# Patient Record
Sex: Male | Born: 1985 | Race: Black or African American | Hispanic: No | Marital: Single | Smoking: Current every day smoker
Health system: Southern US, Community
[De-identification: ages and names within clinical notes are randomized; demographics above are authoritative.]

---
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
## 2003-09-18 ENCOUNTER — Emergency Department (HOSPITAL_COMMUNITY): Admission: EM | Admit: 2003-09-18 | Discharge: 2003-09-18 | Payer: Self-pay | Admitting: Emergency Medicine

## 2011-01-11 ENCOUNTER — Emergency Department (HOSPITAL_COMMUNITY): Payer: Self-pay

## 2011-01-11 ENCOUNTER — Emergency Department (HOSPITAL_COMMUNITY)
Admission: EM | Admit: 2011-01-11 | Discharge: 2011-01-11 | Disposition: A | Discharge status: Home / Self Care | Payer: Self-pay | Attending: Emergency Medicine | Admitting: Emergency Medicine

## 2011-01-11 DIAGNOSIS — S5000XA Contusion of unspecified elbow, initial encounter: Secondary | ICD-10-CM | POA: Insufficient documentation

## 2011-01-11 DIAGNOSIS — M25529 Pain in unspecified elbow: Secondary | ICD-10-CM | POA: Insufficient documentation

## 2011-01-11 DIAGNOSIS — W1809XA Striking against other object with subsequent fall, initial encounter: Secondary | ICD-10-CM | POA: Insufficient documentation

## 2011-01-11 DIAGNOSIS — IMO0002 Reserved for concepts with insufficient information to code with codable children: Secondary | ICD-10-CM | POA: Insufficient documentation

## 2013-12-13 ENCOUNTER — Encounter (HOSPITAL_COMMUNITY): Payer: Self-pay | Admitting: Emergency Medicine

## 2013-12-13 ENCOUNTER — Emergency Department (HOSPITAL_COMMUNITY)
Admission: EM | Admit: 2013-12-13 | Discharge: 2013-12-13 | Disposition: A | Discharge status: Home / Self Care | Payer: Self-pay | Attending: Emergency Medicine | Admitting: Emergency Medicine

## 2013-12-13 ENCOUNTER — Emergency Department (HOSPITAL_COMMUNITY): Payer: Self-pay

## 2013-12-13 DIAGNOSIS — Z720 Tobacco use: Secondary | ICD-10-CM | POA: Insufficient documentation

## 2013-12-13 DIAGNOSIS — Y9289 Other specified places as the place of occurrence of the external cause: Secondary | ICD-10-CM | POA: Insufficient documentation

## 2013-12-13 DIAGNOSIS — S99921A Unspecified injury of right foot, initial encounter: Secondary | ICD-10-CM

## 2013-12-13 DIAGNOSIS — W101XXA Fall (on)(from) sidewalk curb, initial encounter: Secondary | ICD-10-CM | POA: Insufficient documentation

## 2013-12-13 DIAGNOSIS — Y9389 Activity, other specified: Secondary | ICD-10-CM | POA: Insufficient documentation

## 2013-12-13 MED ORDER — TRAMADOL-ACETAMINOPHEN 37.5-325 MG PO TABS: 1.0000 | ORAL_TABLET | Freq: Four times a day (QID) | ORAL | Status: DC | PRN

## 2013-12-13 MED ORDER — IBUPROFEN 200 MG PO TABS
400.0000 mg | ORAL_TABLET | Freq: Once | ORAL | Status: AC
  Administered 2013-12-13: 400 mg via ORAL
  Filled 2013-12-13: qty 2

## 2013-12-13 MED ORDER — OXYCODONE-ACETAMINOPHEN 5-325 MG PO TABS
1.0000 | ORAL_TABLET | Freq: Once | ORAL | Status: AC
  Administered 2013-12-13: 1 via ORAL
  Filled 2013-12-13: qty 1
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
## 2013-12-13 NOTE — Discharge Instructions (Signed)
Take the prescribed medication as directed.  Recommend icing and elevating foot at home to help with pain and swelling. Follow-up with orthopedics if no improvement in the next week or if symptoms worsen. Return to the ED for new or worsening symptoms.
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
## 2013-12-14 NOTE — ED Provider Notes (Signed)
Medical screening examination/treatment/procedure(s) were performed by non-physician practitioner and as supervising physician I was immediately available for consultation/collaboration.   EKG Interpretation None        Samuel JesterKathleen Shaunta Oncale, DO 12/14/13 1606

## 2016-05-09 ENCOUNTER — Encounter (HOSPITAL_COMMUNITY): Payer: Self-pay | Admitting: Emergency Medicine

## 2016-05-09 ENCOUNTER — Emergency Department (HOSPITAL_COMMUNITY)
Admission: EM | Admit: 2016-05-09 | Discharge: 2016-05-09 | Disposition: A | Discharge status: Home / Self Care | Payer: Self-pay | Attending: Emergency Medicine | Admitting: Emergency Medicine

## 2016-05-09 DIAGNOSIS — B9689 Other specified bacterial agents as the cause of diseases classified elsewhere: Secondary | ICD-10-CM | POA: Insufficient documentation

## 2016-05-09 DIAGNOSIS — J028 Acute pharyngitis due to other specified organisms: Secondary | ICD-10-CM | POA: Insufficient documentation

## 2016-05-09 DIAGNOSIS — F17200 Nicotine dependence, unspecified, uncomplicated: Secondary | ICD-10-CM | POA: Insufficient documentation

## 2016-05-09 LAB — RAPID STREP SCREEN (MED CTR MEBANE ONLY): Streptococcus, Group A Screen (Direct): NEGATIVE

## 2016-05-09 MED ORDER — PENICILLIN V POTASSIUM 500 MG PO TABS: 500.0000 mg | ORAL_TABLET | Freq: Three times a day (TID) | ORAL | 0 refills | Status: DC

## 2016-05-09 MED ORDER — HYDROCODONE-ACETAMINOPHEN 5-325 MG PO TABS
1.0000 | ORAL_TABLET | Freq: Once | ORAL | Status: AC
  Administered 2016-05-09: 1 via ORAL
  Filled 2016-05-09: qty 1

## 2016-05-09 MED ORDER — IBUPROFEN 800 MG PO TABS: 800.0000 mg | ORAL_TABLET | Freq: Three times a day (TID) | ORAL | 0 refills | Status: DC

## 2016-05-09 MED ORDER — ACETAMINOPHEN 325 MG PO TABS
650.0000 mg | ORAL_TABLET | Freq: Once | ORAL | Status: AC | PRN
  Administered 2016-05-09: 650 mg via ORAL

## 2016-05-09 MED ORDER — ACETAMINOPHEN 325 MG PO TABS
ORAL_TABLET | ORAL | Status: AC
  Filled 2016-05-09: qty 2

## 2016-05-09 NOTE — ED Triage Notes (Signed)
Pt presents to ED for assessment of sore throat, generalized body aches and fevers at home x 3 days.  Fever in triage.

## 2016-05-09 NOTE — Discharge Instructions (Signed)
You may take tylenol in addition to the medications we give you. Return as needed for worsening symptoms.

## 2016-05-09 NOTE — ED Provider Notes (Signed)
[facility] Provider Note    By signing my name below, I, Jason Flynn, attest that this documentation has been prepared under the direction and in the presence of [facility], [place]. Electronically Signed: Earmon Flynn, ED Scribe. 05/09/16. 9:24 PM.    History   Chief Complaint Chief Complaint  Patient presents with  . Sore Throat     The history is provided by the patient and medical records. No language interpreter was used.    Jason Flynn is a 31 y.o. male who presents to the Emergency Department complaining of sore throat that began three days ago. He reports associated fever, chills, HA and generalized body aches. He has not taken anything for pain relief. Swallowing intensifies his pain. There are no alleviating factors noted. He denies difficulty swallowing or breathing, drooling, dental issues, otalgia, visual changes, neck pain, abdominal pain, nausea, vomiting. He denies allergies to anything.   History reviewed. No pertinent past medical history.  There are no active problems to display for this patient.   History reviewed. No pertinent surgical history.     Home Medications    Prior to Admission medications   Medication Sig Start Date End Date Taking? Authorizing Provider  ibuprofen (ADVIL,MOTRIN) 800 MG tablet Take 1 tablet (800 mg total) by mouth 3 (three) times daily. 05/09/16   Bilbo Carcamo Orlene Och, NP  penicillin v potassium (VEETID) 500 MG tablet Take 1 tablet (500 mg total) by mouth 3 (three) times daily. 05/09/16   Becket Wecker Orlene Och, NP    Family History History reviewed. No pertinent family history.  Social History Social History  Substance Use Topics  . Smoking status: Current Every Day Smoker    Packs/day: 0.50  . Smokeless tobacco: Never Used  . Alcohol use No     Allergies   Patient has no known allergies.   Review of Systems Review of Systems  Constitutional: Positive for chills and fever.  HENT: Positive for ear pain (left)  and sore throat. Negative for congestion, dental problem, drooling, facial swelling and sinus pressure.   Eyes: Negative for redness and itching.  Respiratory: Negative for cough and shortness of breath.   Cardiovascular: Negative for chest pain.  Gastrointestinal: Negative for abdominal pain, nausea and vomiting.  Musculoskeletal: Positive for myalgias. Negative for back pain.  Skin: Negative for color change and rash.  Neurological: Positive for headaches. Negative for speech difficulty.  Hematological: Positive for adenopathy.  Psychiatric/Behavioral: Negative for confusion.     Physical Exam Updated Vital Signs BP 126/74 (BP Location: Left Arm)   Pulse 107   Temp 102.2 F (39 C) (Oral)   Resp 18   Ht 5\' 6"  (1.676 m)   Wt 165 lb (74.8 kg)   SpO2 99%   BMI 26.63 kg/m   Physical Exam  Constitutional: He appears well-developed and well-nourished. No distress.  HENT:  Head: Normocephalic.  Right Ear: Tympanic membrane normal.  Left Ear: Tympanic membrane normal.  Mouth/Throat: Uvula is midline and mucous membranes are normal. No trismus in the jaw. Posterior oropharyngeal erythema present. No tonsillar abscesses. No tonsillar exudate.  Bilateral tonsillar enlargement. Kissing tonsils noted.  Eyes: EOM are normal.  Neck: Normal range of motion. Neck supple.  Cardiovascular: Normal rate and regular rhythm.   Pulmonary/Chest: Effort normal and breath sounds normal.  Abdominal: Soft. Bowel sounds are normal. There is no tenderness.  Musculoskeletal: Normal range of motion.  Lymphadenopathy:    He has cervical adenopathy.  Neurological: He is alert.  Skin:  Skin is warm and dry.  Psychiatric: He has a normal mood and affect. His behavior is normal.  Nursing note and vitals reviewed.    ED Treatments / Results   DIAGNOSTIC STUDIES: Oxygen Saturation is 99% on RA, normal by my interpretation.   COORDINATION OF CARE: 9:20 PM- based on clinical findings, will treat for  strep throat although rapid strep was negative. Culture pending. Pt verbalizes understanding and agrees to plan.  Medications  acetaminophen (TYLENOL) tablet 650 mg (650 mg Oral Given 05/09/16 1636)  HYDROcodone-acetaminophen (NORCO/VICODIN) 5-325 MG per tablet 1 tablet (1 tablet Oral Given 05/09/16 2128)     Labs (all labs ordered are listed, but only abnormal results are displayed) Labs Reviewed  RAPID STREP SCREEN (NOT AT [facility])  CULTURE, GROUP A STREP [facility])   Radiology No results found.  Procedures Procedures (including critical care time)  Medications Ordered in ED Medications  acetaminophen (TYLENOL) tablet 650 mg (650 mg Oral Given 05/09/16 1636)  HYDROcodone-acetaminophen (NORCO/VICODIN) 5-325 MG per tablet 1 tablet (1 tablet Oral Given 05/09/16 2128)     Initial Impression / Assessment and Plan / ED Course  I have reviewed the triage vital signs and the nursing notes.  Pt presents for a sore throat, fever, chills and generalized body aches for the past three days. Pt rapid strep test negative. Clinically pt presents as strep throat so will treat accordingly. Pt is tolerating secretions. Presentation not concerning for peritonsillar abscess or spread of infection to deep spaces of the throat; patent airway. Pt will be discharged with prescription for PCN. Specific return precautions discussed. Recommended PCP follow up. Pt appears safe for discharge.  I personally performed the services described in this documentation, which was scribed in my presence. The recorded information has been reviewed and is accurate.   Final Clinical Impressions(s) / ED Diagnoses   Final diagnoses:  Acute pharyngitis due to other specified organisms    New Prescriptions Discharge Medication List as of 05/09/2016  9:28 PM    START taking these medications   Details  ibuprofen (ADVIL,MOTRIN) 800 MG tablet Take 1 tablet (800 mg total) by mouth 3 (three) times daily., Starting Fri 05/09/2016, Print      penicillin v potassium (VEETID) 500 MG tablet Take 1 tablet (500 mg total) by mouth 3 (three) times daily., Starting Fri 05/09/2016, Print         OrlandHope M Hila Bolding, NP 05/10/16 0210    Mancel BaleElliott Wentz, MD 05/11/16 [phone]

## 2016-05-12 LAB — CULTURE, GROUP A STREP (THRC)

## 2016-06-03 ENCOUNTER — Encounter (HOSPITAL_COMMUNITY): Payer: Self-pay

## 2016-06-03 ENCOUNTER — Emergency Department (HOSPITAL_COMMUNITY)
Admission: EM | Admit: 2016-06-03 | Discharge: 2016-06-03 | Disposition: A | Discharge status: Home / Self Care | Payer: Self-pay | Attending: Emergency Medicine | Admitting: Emergency Medicine

## 2016-06-03 DIAGNOSIS — K029 Dental caries, unspecified: Secondary | ICD-10-CM | POA: Insufficient documentation

## 2016-06-03 DIAGNOSIS — K0889 Other specified disorders of teeth and supporting structures: Secondary | ICD-10-CM | POA: Insufficient documentation

## 2016-06-03 DIAGNOSIS — F17200 Nicotine dependence, unspecified, uncomplicated: Secondary | ICD-10-CM | POA: Insufficient documentation

## 2016-06-03 MED ORDER — PENICILLIN V POTASSIUM 500 MG PO TABS: 500.0000 mg | ORAL_TABLET | Freq: Three times a day (TID) | ORAL | 0 refills | Status: DC

## 2016-06-03 MED ORDER — IBUPROFEN 800 MG PO TABS
800.0000 mg | ORAL_TABLET | Freq: Once | ORAL | Status: AC
  Administered 2016-06-03: 800 mg via ORAL
  Filled 2016-06-03: qty 1

## 2016-06-03 MED ORDER — IBUPROFEN 800 MG PO TABS: 800.0000 mg | ORAL_TABLET | Freq: Three times a day (TID) | ORAL | 0 refills | Status: DC

## 2016-06-03 NOTE — Discharge Instructions (Signed)
Please read and follow all provided instructions.  Your diagnoses today include:  1. Toothache     The exam and treatment you received today has been provided on an emergency basis only. This is not a substitute for complete medical or dental care.  Tests performed today include:  Vital signs. See below for your results today.   Medications prescribed:   Penicillin - antibiotic  You have been prescribed an antibiotic medicine: take the entire course of medicine even if you are feeling better. Stopping early can cause the antibiotic not to work.   Ibuprofen (Motrin, Advil) - anti-inflammatory pain medication  Do not exceed 600mg  ibuprofen every 6 hours, take with food  You have been prescribed an anti-inflammatory medication or NSAID. Take with food. Take smallest effective dose for the shortest duration needed for your pain. Stop taking if you experience stomach pain or vomiting.   Take any prescribed medications only as directed.  Home care instructions:  Follow any educational materials contained in this packet.  Follow-up instructions: Please follow-up with your dentist for further evaluation of your symptoms.   Dental Assistance: See dental referrals  Return instructions:   Please return to the Emergency Department if you experience worsening symptoms.  Please return if you develop a fever, you develop more swelling in your face or neck, you have trouble breathing or swallowing food.  Please return if you have any other emergent concerns.  Additional Information:  Your vital signs today were: BP 138/84 (BP Location: Left Arm)    Pulse (!) 54    Temp 99.1 F (37.3 C) (Oral)    Resp 16    SpO2 98%  If your blood pressure (BP) was elevated above 135/85 this visit, please have this repeated by your doctor within one month. --------------

## 2016-06-03 NOTE — ED Triage Notes (Signed)
Pt here for dental pain. Decay noted to right upper tooth. Pt states it has been bothering him for months but he has been unable to get the tooth pulled. Pt states dental pain is causing headache.

## 2016-06-03 NOTE — ED Provider Notes (Signed)
[facility] Provider Note   CSN: [id number] Arrival date & time: 06/03/16  [id number]     History   Chief Complaint Chief Complaint  Patient presents with  . Dental Pain    HPI Jason Flynn is a 31 y.o. male.  Patient presents with complaint of right-sided lower dental pain for 2 days. Pain radiates to the right face. No associated facial swelling, fevers, vomiting. No difficulty swallowing or breathing. Onset of symptoms acute. Course is constant. Nothing makes symptoms better or worse.      History reviewed. No pertinent past medical history.  There are no active problems to display for this patient.   History reviewed. No pertinent surgical history.     Home Medications    Prior to Admission medications   Medication Sig Start Date End Date Taking? Authorizing Provider  ibuprofen (ADVIL,MOTRIN) 800 MG tablet Take 1 tablet (800 mg total) by mouth 3 (three) times daily. 06/03/16   Renne CriglerJoshua Nanami Whitelaw, PA-C  penicillin v potassium (VEETID) 500 MG tablet Take 1 tablet (500 mg total) by mouth 3 (three) times daily. 06/03/16   Renne CriglerJoshua Mattia Liford, PA-C    Family History History reviewed. No pertinent family history.  Social History Social History  Substance Use Topics  . Smoking status: Current Every Day Smoker    Packs/day: 0.50  . Smokeless tobacco: Never Used  . Alcohol use No     Allergies   Patient has no known allergies.   Review of Systems Review of Systems  Constitutional: Negative for fever.  HENT: Positive for dental problem. Negative for ear pain, facial swelling, sore throat and trouble swallowing.   Respiratory: Negative for shortness of breath and stridor.   Musculoskeletal: Negative for neck pain.  Skin: Negative for color change.  Neurological: Negative for headaches.     Physical Exam Updated Vital Signs BP 138/84 (BP Location: Left Arm)   Pulse (!) 54   Temp 99.1 F (37.3 C) (Oral)   Resp 16   SpO2 98%   Physical Exam    Constitutional: He appears well-developed and well-nourished.  HENT:  Head: Normocephalic and atraumatic.  Right Ear: Tympanic membrane, external ear and ear canal normal.  Left Ear: Tympanic membrane, external ear and ear canal normal.  Nose: Nose normal.  Mouth/Throat: Uvula is midline, oropharynx is clear and moist and mucous membranes are normal. No trismus in the jaw. Abnormal dentition. Dental caries present. No dental abscesses or uvula swelling. No tonsillar abscesses.  Patient with advanced periodontal disease. There is a crack noted in tooth #29 which is where patient is hurting. Generalized edema of the gums without gross or palpable abscess.  Eyes: Pupils are equal, round, and reactive to light.  Neck: Normal range of motion. Neck supple.  No neck swelling or Lugwig's angina  Neurological: He is alert.  Skin: Skin is warm and dry.  Psychiatric: He has a normal mood and affect.  Nursing note and vitals reviewed.    ED Treatments / Results   Procedures Procedures (including critical care time)  Medications Ordered in ED Medications  ibuprofen (ADVIL,MOTRIN) tablet 800 mg (not administered)     Initial Impression / Assessment and Plan / ED Course  I have reviewed the triage vital signs and the nursing notes.  Pertinent labs & imaging results that were available during my care of the patient were reviewed by me and considered in my medical decision making (see chart for details).     10:28 AM Patient seen and examined.  Medications ordered.   Vital signs reviewed and are as follows: BP 138/84 (BP Location: Left Arm)   Pulse (!) 54   Temp 99.1 F (37.3 C) (Oral)   Resp 16   SpO2 98%   Patient counseled to take prescribed medications as directed, return with worsening facial or neck swelling, and to follow-up with their dentist as soon as possible.    Final Clinical Impressions(s) / ED Diagnoses   Final diagnoses:  Toothache   Patient with toothache. No  fever. Exam unconcerning for Ludwig's angina or other deep tissue infection in neck.   As there is gum swelling, erythema, will treat with antibiotic and pain medicine. Urged patient to follow-up with dentist.     New Prescriptions New Prescriptions   IBUPROFEN (ADVIL,MOTRIN) 800 MG TABLET    Take 1 tablet (800 mg total) by mouth 3 (three) times daily.   PENICILLIN V POTASSIUM (VEETID) 500 MG TABLET    Take 1 tablet (500 mg total) by mouth 3 (three) times daily.     Renne Crigler, PA-C 06/03/16 1029    Alvira Monday, MD 06/07/16 1819

## 2016-10-14 ENCOUNTER — Emergency Department (HOSPITAL_COMMUNITY)
Admission: EM | Admit: 2016-10-14 | Discharge: 2016-10-14 | Disposition: A | Discharge status: Home / Self Care | Payer: Self-pay | Attending: Physician Assistant | Admitting: Physician Assistant

## 2016-10-14 ENCOUNTER — Emergency Department (HOSPITAL_COMMUNITY): Payer: Self-pay

## 2016-10-14 ENCOUNTER — Encounter (HOSPITAL_COMMUNITY): Payer: Self-pay | Admitting: *Deleted

## 2016-10-14 ENCOUNTER — Encounter (HOSPITAL_COMMUNITY): Payer: Self-pay | Admitting: Emergency Medicine

## 2016-10-14 ENCOUNTER — Emergency Department (HOSPITAL_COMMUNITY)
Admission: EM | Admit: 2016-10-14 | Discharge: 2016-10-14 | Disposition: A | Discharge status: Home / Self Care | Payer: Self-pay | Attending: Emergency Medicine | Admitting: Emergency Medicine

## 2016-10-14 DIAGNOSIS — Y9289 Other specified places as the place of occurrence of the external cause: Secondary | ICD-10-CM | POA: Insufficient documentation

## 2016-10-14 DIAGNOSIS — Y999 Unspecified external cause status: Secondary | ICD-10-CM | POA: Insufficient documentation

## 2016-10-14 DIAGNOSIS — S80212A Abrasion, left knee, initial encounter: Secondary | ICD-10-CM | POA: Insufficient documentation

## 2016-10-14 DIAGNOSIS — X58XXXA Exposure to other specified factors, initial encounter: Secondary | ICD-10-CM | POA: Insufficient documentation

## 2016-10-14 DIAGNOSIS — S60512A Abrasion of left hand, initial encounter: Secondary | ICD-10-CM | POA: Insufficient documentation

## 2016-10-14 DIAGNOSIS — W3400XA Accidental discharge from unspecified firearms or gun, initial encounter: Secondary | ICD-10-CM

## 2016-10-14 DIAGNOSIS — M25562 Pain in left knee: Secondary | ICD-10-CM | POA: Insufficient documentation

## 2016-10-14 DIAGNOSIS — F17200 Nicotine dependence, unspecified, uncomplicated: Secondary | ICD-10-CM | POA: Insufficient documentation

## 2016-10-14 DIAGNOSIS — Y9302 Activity, running: Secondary | ICD-10-CM | POA: Insufficient documentation

## 2016-10-14 MED ORDER — IBUPROFEN 200 MG PO TABS: 600.0000 mg | ORAL_TABLET | Freq: Once | ORAL | Status: DC

## 2016-10-14 MED ORDER — IBUPROFEN 400 MG PO TABS: 400.0000 mg | ORAL_TABLET | Freq: Once | ORAL | Status: DC | PRN

## 2016-10-14 MED ORDER — IBUPROFEN 800 MG PO TABS: 800.0000 mg | ORAL_TABLET | Freq: Three times a day (TID) | ORAL | 0 refills | Status: DC | PRN
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
## 2016-10-14 NOTE — ED Notes (Signed)
[facility]; informed pt of delay

## 2016-10-14 NOTE — Progress Notes (Signed)
Orthopedic Tech Progress Note Patient Details:  Jason MouseDominique L Flynn [birth year] [id number]  Ortho Devices Type of Ortho Device: Crutches, Knee Sleeve Ortho Device/Splint Location: lle Ortho Device/Splint Interventions: Ordered, Application, Adjustment   Trinna PostMartinez, Ashna Dorough J 10/14/2016, 11:25 PM
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
## 2016-10-14 NOTE — Discharge Instructions (Signed)
Rest - avoid putting weight on your left leg for a couple days Ice - ice for 20 minutes at a time, several times a day Compression - wear brace to provide support Elevate - elevate ankle above level of heart Ibuprofen - take with food. Take up to 3-4 times daily
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
## 2017-11-27 ENCOUNTER — Encounter (HOSPITAL_COMMUNITY): Payer: Self-pay

## 2017-11-27 ENCOUNTER — Other Ambulatory Visit: Payer: Self-pay

## 2017-11-27 ENCOUNTER — Emergency Department (HOSPITAL_COMMUNITY)
Admission: EM | Admit: 2017-11-27 | Discharge: 2017-11-27 | Disposition: A | Discharge status: Home / Self Care | Payer: Self-pay | Attending: Emergency Medicine | Admitting: Emergency Medicine

## 2017-11-27 DIAGNOSIS — R1032 Left lower quadrant pain: Secondary | ICD-10-CM | POA: Insufficient documentation

## 2017-11-27 DIAGNOSIS — F17200 Nicotine dependence, unspecified, uncomplicated: Secondary | ICD-10-CM | POA: Insufficient documentation

## 2017-11-27 DIAGNOSIS — R1012 Left upper quadrant pain: Secondary | ICD-10-CM | POA: Insufficient documentation

## 2017-11-27 DIAGNOSIS — R112 Nausea with vomiting, unspecified: Secondary | ICD-10-CM | POA: Insufficient documentation

## 2017-11-27 LAB — CBC WITH DIFFERENTIAL/PLATELET
Basophils Absolute: 0 10*3/uL (ref 0.0–0.1)
Basophils Relative: 0 %
EOS PCT: 0 %
Eosinophils Absolute: 0 10*3/uL (ref 0.0–0.7)
HCT: 37.2 % — ABNORMAL LOW (ref 39.0–52.0)
Hemoglobin: 13 g/dL (ref 13.0–17.0)
Lymphocytes Relative: 8 %
Lymphs Abs: 0.9 10*3/uL (ref 0.7–4.0)
MCH: 25.9 pg — AB (ref 26.0–34.0)
MCHC: 34.9 g/dL (ref 30.0–36.0)
MCV: 74.3 fL — AB (ref 78.0–100.0)
Monocytes Absolute: 0.6 10*3/uL (ref 0.1–1.0)
Monocytes Relative: 6 %
NEUTROS ABS: 9.2 10*3/uL — AB (ref 1.7–7.7)
NEUTROS PCT: 86 %
Platelets: 249 10*3/uL (ref 150–400)
RBC: 5.01 MIL/uL (ref 4.22–5.81)
RDW: 14.5 % (ref 11.5–15.5)
WBC: 10.7 10*3/uL — ABNORMAL HIGH (ref 4.0–10.5)

## 2017-11-27 LAB — COMPREHENSIVE METABOLIC PANEL
ALT: 11 U/L (ref 0–44)
AST: 19 U/L (ref 15–41)
Albumin: 4.1 g/dL (ref 3.5–5.0)
Alkaline Phosphatase: 48 U/L (ref 38–126)
Anion gap: 9 (ref 5–15)
BILIRUBIN TOTAL: 0.5 mg/dL (ref 0.3–1.2)
BUN: 14 mg/dL (ref 6–20)
CALCIUM: 9.4 mg/dL (ref 8.9–10.3)
CO2: 25 mmol/L (ref 22–32)
CREATININE: 1.28 mg/dL — AB (ref 0.61–1.24)
Chloride: 109 mmol/L (ref 98–111)
GFR calc Af Amer: >60 mL/min (ref >60)
GFR calc non Af Amer: >60 mL/min (ref >60)
Glucose, Bld: 130 mg/dL — ABNORMAL HIGH (ref 70–99)
Potassium: 3.4 mmol/L — ABNORMAL LOW (ref 3.5–5.1)
Sodium: 143 mmol/L (ref 135–145)
TOTAL PROTEIN: 7.3 g/dL (ref 6.5–8.1)

## 2017-11-27 LAB — LIPASE, BLOOD: Lipase: 64 U/L — ABNORMAL HIGH (ref 11–51)

## 2017-11-27 MED ORDER — ACETAMINOPHEN 325 MG PO TABS
650.0000 mg | ORAL_TABLET | Freq: Once | ORAL | Status: AC
  Administered 2017-11-27: 650 mg via ORAL
  Filled 2017-11-27: qty 2

## 2017-11-27 MED ORDER — ONDANSETRON 4 MG PO TBDP
4.0000 mg | ORAL_TABLET | Freq: Once | ORAL | Status: AC
  Administered 2017-11-27: 4 mg via ORAL
  Filled 2017-11-27: qty 1

## 2017-11-27 MED ORDER — PROMETHAZINE HCL 25 MG RE SUPP
25.0000 mg | Freq: Once | RECTAL | Status: DC
  Filled 2017-11-27: qty 1

## 2017-11-27 MED ORDER — ONDANSETRON 4 MG PO TBDP: 4.0000 mg | ORAL_TABLET | Freq: Three times a day (TID) | ORAL | 0 refills | Status: DC | PRN

## 2017-11-27 MED ORDER — SODIUM CHLORIDE 0.9 % IV BOLUS: 1000.0000 mL | Freq: Once | INTRAVENOUS | Status: DC

## 2017-11-27 NOTE — ED Provider Notes (Signed)
[facility]-EMERGENCY DEPT Provider Note   CSN: [id number] Arrival date & time: 11/27/17  1152     History   Chief Complaint No chief complaint on file.   HPI Jason Flynn is a 32 y.o. male without significant past medical history, presenting to the ED with complaint of left lower abdominal pain with associated nausea and vomiting.  He states symptoms began this morning gradually worsened.  No medications tried prior to arrival.  He denies constipation or diarrhea, fevers, urinary symptoms.  No history of abdominal surgery.  Does endorse marijuana use last night.  Denies history of diverticulitis.  The history is provided by the patient.    History reviewed. No pertinent past medical history.  There are no active problems to display for this patient.    No past surgical history on file.      Home Medications    Prior to Admission medications   Medication Sig Start Date End Date Taking? Authorizing Provider  ibuprofen (ADVIL,MOTRIN) 800 MG tablet Take 1 tablet (800 mg total) by mouth every 8 (eight) hours as needed. Patient not taking: Reported on 11/27/2017 10/14/16   Long, Arlyss Repress, MD  ondansetron (ZOFRAN ODT) 4 MG disintegrating tablet Take 1 tablet (4 mg total) by mouth every 8 (eight) hours as needed for nausea or vomiting. 11/27/17   Robby Pirani, [place] N, PA-C  penicillin v potassium (VEETID) 500 MG tablet Take 1 tablet (500 mg total) by mouth 3 (three) times daily. Patient not taking: Reported on 11/27/2017 06/03/16   Renne Crigler, PA-C    Family History History reviewed. No pertinent family history.  Social History Social History   Tobacco Use  . Smoking status: Current Every Day Smoker    Packs/day: 0.50  . Smokeless tobacco: Never Used  Substance Use Topics  . Alcohol use: No  . Drug use: Yes    Types: Marijuana    Comment: everyday     Allergies   Patient has no known allergies.   Review of Systems Review of Systems    Constitutional: Negative for fever.  Gastrointestinal: Positive for abdominal pain, nausea and vomiting. Negative for constipation and diarrhea.  Genitourinary: Negative for dysuria and frequency.  All other systems reviewed and are negative.    Physical Exam Updated Vital Signs BP (!) 104/57 (BP Location: Right Arm)   Pulse (!) 55   Temp 97.7 F (36.5 C) (Oral)   Resp 15   Ht 5\' 6"  (1.676 m)   Wt 68 kg   SpO2 100%   BMI 24.21 kg/m   Physical Exam  Constitutional: He appears well-developed and well-nourished.  Patient actively vomiting.  HENT:  Head: Normocephalic and atraumatic.  Eyes: Conjunctivae are normal.  Cardiovascular: Normal rate, regular rhythm and normal heart sounds.  Pulmonary/Chest: Effort normal and breath sounds normal. No respiratory distress.  Abdominal: Soft. Bowel sounds are normal. He exhibits no distension and no mass. There is tenderness in the epigastric area, left upper quadrant and left lower quadrant. There is no rebound and no guarding.  Neurological: He is alert.  Skin: Skin is warm.  Psychiatric: He has a normal mood and affect. His behavior is normal.  Nursing note and vitals reviewed.    ED Treatments / Results  Labs (all labs ordered are listed, but only abnormal results are displayed) Labs Reviewed  CBC WITH DIFFERENTIAL/PLATELET - Abnormal; Notable for the following components:      Result Value   WBC 10.7 (*)  HCT 37.2 (*)    MCV 74.3 (*)    MCH 25.9 (*)    Neutro Abs 9.2 (*)    All other components within normal limits  COMPREHENSIVE METABOLIC PANEL - Abnormal; Notable for the following components:   Potassium 3.4 (*)    Glucose, Bld 130 (*)    Creatinine, Ser 1.28 (*)    All other components within normal limits  LIPASE, BLOOD - Abnormal; Notable for the following components:   Lipase 64 (*)    All other components within normal limits    EKG None  Radiology No results found.  Procedures Procedures (including  critical care time)  Medications Ordered in ED Medications  sodium chloride 0.9 % bolus 1,000 mL (0 mLs Intravenous Hold 11/27/17 1332)  promethazine (PHENERGAN) suppository 25 mg (0 mg Rectal Hold 11/27/17 1543)  ondansetron (ZOFRAN-ODT) disintegrating tablet 4 mg (4 mg Oral Given 11/27/17 1328)  acetaminophen (TYLENOL) tablet 650 mg (650 mg Oral Given 11/27/17 1705)     Initial Impression / Assessment and Plan / ED Course  I have reviewed the triage vital signs and the nursing notes.  Pertinent labs & imaging results that were available during my care of the patient were reviewed by me and considered in my medical decision making (see chart for details).  Clinical Course as of Nov 27 1709  Fri Nov 27, 2017  1458 Pt re-evaluated and states he feels better with the nausea though he vomited the PO challenge. Continues to complain of pain. Offered IV for pain medication and nausea control though patient declined.    [JR]  1645 Pt re-evaluated, abdominal exam improved, pt denies continued vomiting and tolerating PO.   [JR]    Clinical Course User Index [JR] Aaryav Hopfensperger, [place] N, PA-C    Pt presenting with N/V and abdominal pain that began this morning.  Abdomen is soft w epigastric and left-sided tenderness. Patient is nontoxic, nonseptic appearing. Afebrile, VSS. Patient's symptoms adequately managed in emergency department, tolerating PO after zofran. Pt refused IV d/t fear of needles.  Labs reviewed, very slight leukocytosis. Patient does not meet the SIRS or Sepsis criteria.  On repeat exam, pt is sleeping and vomiting has stopped. Patient does not have a surgical abdomen and there are no peritoneal signs.  Low suspicion for appendicitis, bowel obstruction, bowel perforation, cholecystitis. Low suspicion for diverticulitis given acute presentation and symptoms. Suspect viral gastroenteritis vs cannabis-induced vomiting.  Patient discharged home with symptomatic treatment and given strict return  instructions. Pt agreeable to plan and safe for discharge.  Discussed results, findings, treatment and follow up. Patient advised of return precautions. Patient verbalized understanding and agreed with plan.  Final Clinical Impressions(s) / ED Diagnoses   Final diagnoses:  Nausea and vomiting in adult patient    ED Discharge Orders         Ordered    ondansetron (ZOFRAN ODT) 4 MG disintegrating tablet  Every 8 hours PRN     11/27/17 1658           Shavaun Osterloh, [place] N, [place] JerseyPA-C 11/27/17 1711    Shaune PollackIsaacs, [place], MD 11/27/17 1920

## 2017-11-27 NOTE — ED Notes (Signed)
At present we are holding the IV fluids and will try a po challenge after Zofran has had time to be effective.

## 2017-11-27 NOTE — Discharge Instructions (Signed)
Please read instructions below. °Drink clear liquids until your stomach feels better. Then, slowly introduce bland foods into your diet as tolerated, such as bread, rice, apples, bananas. °You can take zofran every 8 hours as needed for nausea. °Follow up with your primary care if symptoms persist. °Return to the ER for severe abdominal pain, fever, uncontrollable vomiting, or new or concerning symptoms. ° °

## 2017-11-27 NOTE — ED Triage Notes (Addendum)
Patient presented to ed with c/o abdominal pain since 9 am this morning. Patient c/o nausea and vomiting. Patient last smoke marijuana last night.

## 2020-08-31 ENCOUNTER — Encounter (HOSPITAL_COMMUNITY): Payer: Self-pay | Admitting: Emergency Medicine

## 2020-08-31 ENCOUNTER — Other Ambulatory Visit: Payer: Self-pay

## 2020-08-31 ENCOUNTER — Emergency Department (HOSPITAL_COMMUNITY): Payer: BLUE CROSS/BLUE SHIELD

## 2020-08-31 ENCOUNTER — Emergency Department (HOSPITAL_COMMUNITY)
Admission: EM | Admit: 2020-08-31 | Discharge: 2020-08-31 | Disposition: A | Discharge status: Home / Self Care | Payer: BLUE CROSS/BLUE SHIELD | Attending: Emergency Medicine | Admitting: Emergency Medicine

## 2020-08-31 DIAGNOSIS — F17210 Nicotine dependence, cigarettes, uncomplicated: Secondary | ICD-10-CM | POA: Insufficient documentation

## 2020-08-31 DIAGNOSIS — R112 Nausea with vomiting, unspecified: Secondary | ICD-10-CM | POA: Diagnosis not present

## 2020-08-31 DIAGNOSIS — R109 Unspecified abdominal pain: Secondary | ICD-10-CM | POA: Insufficient documentation

## 2020-08-31 DIAGNOSIS — R10A2 Flank pain, left side: Secondary | ICD-10-CM

## 2020-08-31 LAB — URINALYSIS, ROUTINE W REFLEX MICROSCOPIC
Bilirubin Urine: NEGATIVE
Glucose, UA: NEGATIVE mg/dL
Ketones, ur: NEGATIVE mg/dL
Leukocytes,Ua: NEGATIVE
Nitrite: NEGATIVE
Protein, ur: NEGATIVE mg/dL
RBC / HPF: >50 RBC/hpf — ABNORMAL HIGH (ref 0–5)
Specific Gravity, Urine: 1.018 (ref 1.005–1.030)
pH: 6 (ref 5.0–8.0)

## 2020-08-31 LAB — CBC
HCT: 35 % — ABNORMAL LOW (ref 39.0–52.0)
Hemoglobin: 11.9 g/dL — ABNORMAL LOW (ref 13.0–17.0)
MCH: 25.6 pg — ABNORMAL LOW (ref 26.0–34.0)
MCHC: 34 g/dL (ref 30.0–36.0)
MCV: 75.3 fL — ABNORMAL LOW (ref 80.0–100.0)
Platelets: 252 10*3/uL (ref 150–400)
RBC: 4.65 MIL/uL (ref 4.22–5.81)
RDW: 14 % (ref 11.5–15.5)
WBC: 6.5 10*3/uL (ref 4.0–10.5)
nRBC: 0 % (ref 0.0–0.2)

## 2020-08-31 LAB — COMPREHENSIVE METABOLIC PANEL
ALT: 11 U/L (ref 0–44)
AST: 22 U/L (ref 15–41)
Albumin: 3.6 g/dL (ref 3.5–5.0)
Alkaline Phosphatase: 42 U/L (ref 38–126)
Anion gap: 7 (ref 5–15)
BUN: 13 mg/dL (ref 6–20)
CO2: 28 mmol/L (ref 22–32)
Calcium: 9 mg/dL (ref 8.9–10.3)
Chloride: 102 mmol/L (ref 98–111)
Creatinine, Ser: 1.34 mg/dL — ABNORMAL HIGH (ref 0.61–1.24)
GFR, Estimated: >60 mL/min (ref >60)
Glucose, Bld: 100 mg/dL — ABNORMAL HIGH (ref 70–99)
Potassium: 3.5 mmol/L (ref 3.5–5.1)
Sodium: 137 mmol/L (ref 135–145)
Total Bilirubin: 0.5 mg/dL (ref 0.3–1.2)
Total Protein: 7 g/dL (ref 6.5–8.1)

## 2020-08-31 LAB — LIPASE, BLOOD: Lipase: 34 U/L (ref 11–51)

## 2020-08-31 MED ORDER — ONDANSETRON 4 MG PO TBDP: ORAL_TABLET | ORAL | 0 refills | Status: DC

## 2020-08-31 MED ORDER — ONDANSETRON 4 MG PO TBDP: 4.0000 mg | ORAL_TABLET | Freq: Once | ORAL | Status: DC

## 2020-08-31 MED ORDER — MORPHINE SULFATE (PF) 4 MG/ML IV SOLN
4.0000 mg | Freq: Once | INTRAVENOUS | Status: AC
  Administered 2020-08-31: 4 mg via INTRAVENOUS
  Filled 2020-08-31 (×2): qty 1

## 2020-08-31 MED ORDER — SODIUM CHLORIDE 0.9 % IV BOLUS
1000.0000 mL | Freq: Once | INTRAVENOUS | Status: AC
  Administered 2020-08-31: 1000 mL via INTRAVENOUS

## 2020-08-31 MED ORDER — OXYCODONE-ACETAMINOPHEN 5-325 MG PO TABS: 1.0000 | ORAL_TABLET | Freq: Once | ORAL | Status: DC

## 2020-08-31 MED ORDER — LORAZEPAM 1 MG PO TABS: 1.0000 mg | ORAL_TABLET | Freq: Once | ORAL | Status: DC

## 2020-08-31 MED ORDER — ONDANSETRON HCL 4 MG/2ML IJ SOLN
4.0000 mg | Freq: Once | INTRAMUSCULAR | Status: AC
  Administered 2020-08-31: 4 mg via INTRAVENOUS
  Filled 2020-08-31 (×2): qty 2

## 2020-08-31 MED ORDER — OXYCODONE-ACETAMINOPHEN 5-325 MG PO TABS: 1.0000 | ORAL_TABLET | Freq: Three times a day (TID) | ORAL | 0 refills | Status: DC | PRN

## 2020-08-31 NOTE — ED Provider Notes (Signed)
Care assumed from PA Petrucelli at shift change, please see her note for full details, but in brief  Jason Flynn is a 35 y.o. male who presents with left flank pain, nausea and vomiting.  Symptoms waxing and waning.  No prior history.   \Work-up significant for hematuria, no leukocytosis and stable hemoglobin, creatinine slightly elevated at 1.34, but otherwise unremarkable, UA without signs of infection.  CT with minimal left hydro, no definitive stone noted, could represent recently passed stone, occult stone or stricture or mucosal lesion leading to some obstruction.   Plan: initial delay in getting meds as pt was declining IV but now agreeable, reassess after pain, if feeling better and tolerating PO, DC with meds and urology follow up  BP 110/69   Pulse 65   Temp 97.6 F (36.4 C) (Oral)   Resp 20   SpO2 94%    ED Course/Procedures   Labs Reviewed  COMPREHENSIVE METABOLIC PANEL - Abnormal; Notable for the following components:      Result Value   Glucose, Bld 100 (*)    Creatinine, Ser 1.34 (*)    All other components within normal limits  CBC - Abnormal; Notable for the following components:   Hemoglobin 11.9 (*)    HCT 35.0 (*)    MCV 75.3 (*)    MCH 25.6 (*)    All other components within normal limits  URINALYSIS, ROUTINE W REFLEX MICROSCOPIC - Abnormal; Notable for the following components:   Hgb urine dipstick LARGE (*)    RBC / HPF >50 (*)    Bacteria, UA RARE (*)    All other components within normal limits  LIPASE, BLOOD   CT Renal Stone Study  Result Date: 08/31/2020 CLINICAL DATA:  Left flank pain, back pain EXAM: CT ABDOMEN AND PELVIS WITHOUT CONTRAST TECHNIQUE: Multidetector CT imaging of the abdomen and pelvis was performed following the standard protocol without IV contrast. COMPARISON:  None. FINDINGS: Lower chest: No acute abnormality. Hepatobiliary: Small cyst noted within the medial left hepatic lobe. Liver otherwise unremarkable. No intra or  extrahepatic biliary ductal dilation. Gallbladder unremarkable. Pancreas: Unremarkable Spleen: Unremarkable Adrenals/Urinary Tract: The adrenal glands are unremarkable. The kidneys are normal in size and position. There is minimal left hydronephrosis. No definite left-sided nephro or urolithiasis. Tiny calcification within the left hemipelvis represents a phlebolith. A 2 mm nonobstructing calculus is seen within the upper pole the right kidney. No hydronephrosis on the right. No ureteral calculi noted on the right. The bladder is unremarkable. Stomach/Bowel: Stomach is within normal limits. Appendix appears normal. No evidence of bowel wall thickening, distention, or inflammatory changes. Vascular/Lymphatic: No significant vascular findings are present. No enlarged abdominal or pelvic lymph nodes. Reproductive: Prostate is unremarkable. Other: No abdominal wall hernia or abnormality. No abdominopelvic ascites. Musculoskeletal: No acute bone abnormality. No lytic or blastic bone lesions. IMPRESSION: Minimal left hydronephrosis. No definite left-sided nephro or urolithiasis. This may represent the residua of a recently passed calculus or obstruction secondary to a intrinsic stricture or mucosal lesion. This could be further assessed with dedicated CT urography. Alternatively, follow-up sonography could be performed to assess for resolution of hydronephrosis. Correlation with urinalysis may also be helpful for further evaluation. Minimal nonobstructive right nephrolithiasis. Electronically Signed   By: Helyn Numbers MD   On: 08/31/2020 05:50     Procedures  MDM  After doses of morphine and Zofran patient is feeling much better, vomiting has resolved and he is tolerating p.o.  Again discussed results of  lab work and CT scan with patient.  Suspect occult kidney stone versus recently passed stone, could have an intrinsic stricture or mucosal lesion restricting flow as well.  We will treat with pain and nausea  medication, due to slight increase in creatinine patient is not a candidate for NSAIDs.  We will have him follow-up with urology.  Return precautions discussed.  Discharged home in good condition.       Dartha Lodge, PA-C 08/31/20 0737    Glynn Octave, MD 08/31/20 [phone]

## 2020-08-31 NOTE — ED Notes (Signed)
Cup of water given for PO challenge.

## 2020-08-31 NOTE — ED Triage Notes (Signed)
Patient states that he woke up with left back pain.  He states that the pain radiates to his left abdomen.  Patient denies any urinary symptoms.  Patient states that he has had many emesis episodes, mild nausea.

## 2020-08-31 NOTE — ED Notes (Signed)
To patient bedside for IV start. Patient declined IV start. Explained that IV pain medication, nausea medication, and IV fluids are ordered. Patient continues to decline IV start. States he doesn't want a needle stick. PA Sammy notified and to bedside to discuss same.

## 2020-08-31 NOTE — ED Provider Notes (Signed)
[facility] EMERGENCY DEPARTMENT Provider Note   CSN: [id number] Arrival date & time: 08/31/20  0419     History Chief Complaint  Patient presents with   Flank Pain    Jason Flynn is a 35 y.o. male with a hx of tobacco abuse who presents to the ED with complaints of left flank pain that began acutely @ 0300 this AM. Pain is located in the left flank, radiates into the abdomen. Waxing/waning in severity. Associated nausea with multiple episodes of emesis. NO alleviating/aggravating factors. Denies fever, hematemesis, diarrhea, melena, hematochezia, or dysuria.   HPI     History reviewed. No pertinent past medical history.  There are no problems to display for this patient.   History reviewed. No pertinent surgical history.     History reviewed. No pertinent family history.  Social History   Tobacco Use   Smoking status: Every Day    Packs/day: 0.50    Pack years: 0.00    Types: Cigarettes   Smokeless tobacco: Never  Substance Use Topics   Alcohol use: No   Drug use: Yes    Types: Marijuana    Comment: everyday    Home Medications Prior to Admission medications   Medication Sig Start Date End Date Taking? Authorizing Provider  ibuprofen (ADVIL,MOTRIN) 800 MG tablet Take 1 tablet (800 mg total) by mouth every 8 (eight) hours as needed. Patient not taking: Reported on 11/27/2017 10/14/16   Long, Arlyss Repress, MD  ondansetron (ZOFRAN ODT) 4 MG disintegrating tablet Take 1 tablet (4 mg total) by mouth every 8 (eight) hours as needed for nausea or vomiting. 11/27/17   Robinson, [place] N, PA-C  penicillin v potassium (VEETID) 500 MG tablet Take 1 tablet (500 mg total) by mouth 3 (three) times daily. Patient not taking: Reported on 11/27/2017 06/03/16   Renne Crigler, PA-C    Allergies    Patient has no known allergies.  Review of Systems   Review of Systems  Constitutional:  Negative for fever.  Respiratory:  Negative for shortness of breath.    Cardiovascular:  Negative for chest pain.  Gastrointestinal:  Positive for abdominal pain, nausea and vomiting. Negative for blood in stool, constipation and diarrhea.  Genitourinary:  Positive for flank pain. Negative for dysuria, hematuria, scrotal swelling and testicular pain.  Neurological:  Negative for syncope.  All other systems reviewed and are negative.  Physical Exam Updated Vital Signs BP 117/90 (BP Location: Left Arm)   Pulse 65   Temp 97.6 F (36.4 C) (Oral)   Resp 20   SpO2 94%   Physical Exam Vitals and nursing note reviewed.  Constitutional:      General: He is in acute distress (appears uncomfortable).     Appearance: He is well-developed. He is not toxic-appearing.     Comments: Actively vomiting.   HENT:     Head: Normocephalic and atraumatic.  Eyes:     General:        Right eye: No discharge.        Left eye: No discharge.     Conjunctiva/sclera: Conjunctivae normal.  Cardiovascular:     Rate and Rhythm: Normal rate and regular rhythm.  Pulmonary:     Effort: Pulmonary effort is normal. No respiratory distress.     Breath sounds: Normal breath sounds. No wheezing, rhonchi or rales.  Abdominal:     General: There is no distension.     Palpations: Abdomen is soft.     Tenderness: There  is no abdominal tenderness. There is no right CVA tenderness, left CVA tenderness, guarding or rebound.  Musculoskeletal:     Cervical back: Neck supple.  Skin:    General: Skin is warm and dry.     Findings: No rash.  Neurological:     Mental Status: He is alert.     Comments: Clear speech.   Psychiatric:        Behavior: Behavior normal.    ED Results / Procedures / Treatments   Labs (all labs ordered are listed, but only abnormal results are displayed) Labs Reviewed  CBC - Abnormal; Notable for the following components:      Result Value   Hemoglobin 11.9 (*)    HCT 35.0 (*)    MCV 75.3 (*)    MCH 25.6 (*)    All other components within normal limits   URINALYSIS, ROUTINE W REFLEX MICROSCOPIC - Abnormal; Notable for the following components:   Hgb urine dipstick LARGE (*)    RBC / HPF >50 (*)    Bacteria, UA RARE (*)    All other components within normal limits  LIPASE, BLOOD  COMPREHENSIVE METABOLIC PANEL    EKG None  Radiology CT Renal Stone Study  Result Date: 08/31/2020 CLINICAL DATA:  Left flank pain, back pain EXAM: CT ABDOMEN AND PELVIS WITHOUT CONTRAST TECHNIQUE: Multidetector CT imaging of the abdomen and pelvis was performed following the standard protocol without IV contrast. COMPARISON:  None. FINDINGS: Lower chest: No acute abnormality. Hepatobiliary: Small cyst noted within the medial left hepatic lobe. Liver otherwise unremarkable. No intra or extrahepatic biliary ductal dilation. Gallbladder unremarkable. Pancreas: Unremarkable Spleen: Unremarkable Adrenals/Urinary Tract: The adrenal glands are unremarkable. The kidneys are normal in size and position. There is minimal left hydronephrosis. No definite left-sided nephro or urolithiasis. Tiny calcification within the left hemipelvis represents a phlebolith. A 2 mm nonobstructing calculus is seen within the upper pole the right kidney. No hydronephrosis on the right. No ureteral calculi noted on the right. The bladder is unremarkable. Stomach/Bowel: Stomach is within normal limits. Appendix appears normal. No evidence of bowel wall thickening, distention, or inflammatory changes. Vascular/Lymphatic: No significant vascular findings are present. No enlarged abdominal or pelvic lymph nodes. Reproductive: Prostate is unremarkable. Other: No abdominal wall hernia or abnormality. No abdominopelvic ascites. Musculoskeletal: No acute bone abnormality. No lytic or blastic bone lesions. IMPRESSION: Minimal left hydronephrosis. No definite left-sided nephro or urolithiasis. This may represent the residua of a recently passed calculus or obstruction secondary to a intrinsic stricture or mucosal  lesion. This could be further assessed with dedicated CT urography. Alternatively, follow-up sonography could be performed to assess for resolution of hydronephrosis. Correlation with urinalysis may also be helpful for further evaluation. Minimal nonobstructive right nephrolithiasis. Electronically Signed   By: Helyn Numbers MD   On: 08/31/2020 05:50    Procedures Procedures   Medications Ordered in ED Medications  sodium chloride 0.9 % bolus 1,000 mL (has no administration in time range)  ondansetron (ZOFRAN) injection 4 mg (has no administration in time range)  morphine 4 MG/ML injection 4 mg (has no administration in time range)    ED Course  I have reviewed the triage vital signs and the nursing notes.  Pertinent labs & imaging results that were available during my care of the patient were reviewed by me and considered in my medical decision making (see chart for details).    MDM Rules/Calculators/A&P  Patient presents to the ED with complaints of left flank pain.  Nontoxic, vitals without significant abnormality, actively vomiting. No focal abdominal tenderness or peritoneal signs.  Morphine, zofran, and fluids ordered.   Additional history obtained:  Additional history obtained from chart review & nursing note review.   Lab Tests:  I Ordered, reviewed, and interpreted labs, which included:  CBC: Mild anemia CMP: elevated creatinine but similar to prior.  Lipase: WNL UA: Hematuria  Imaging Studies ordered:  I ordered imaging studies which included CT renal stone study, I independently reviewed, formal radiology impression shows:  Minimal left hydronephrosis. No definite left-sided nephro or urolithiasis. This may represent the residua of a recently passed calculus or obstruction secondary to a intrinsic stricture or mucosal lesion. This could be further assessed with dedicated CT urography. Alternatively, follow-up sonography could be performed to  assess for resolution of hydronephrosis. Correlation with urinalysis may also be helpful for further evaluation. Minimal nonobstructive right nephrolithiasis  ED Course:  Patient with ureteral colic. Renal function similar to prior, UA without infection.  He is refusing IV access, discussed with him at length the benefits of IV placement for symptomatic management, ultimately changed his mind multiple times, attending Dr. Manus Gunning spoke with patient as well- he is agreeable to IV placement at this time- nursing staff made aware.   Patient care signed out to [facility] PA-C at change of shift pending re-evaluation & disposition. If patient is feeling improved and is able to tolerate PO anticipate discharge home with urology follow up.   Findings and plan of care discussed with supervising physician Dr. Manus Gunning who is in agreement.   Portions of this note were generated with Scientist, clinical (histocompatibility and immunogenetics). Dictation errors may occur despite best attempts at proofreading.  Final Clinical Impression(s) / ED Diagnoses Final diagnoses:  None    Rx / DC Orders ED Discharge Orders     None        Cherly Anderson, PA-C 08/31/20 1287    Glynn Octave, MD 08/31/20 8676    Glynn Octave, MD 08/31/20 [phone]

## 2020-08-31 NOTE — Discharge Instructions (Addendum)
You were seen in the emergency department and your symptoms are likely due to a recently passed kidney stone, versus a stone that did not show up on your scan.  Could also have a soft tissue structure in the kidney reducing the flow of urine.  We are sending you home with multiple medications to assist with passing the stone:   -Flomax-this is a medication to help pass the stone, it allows urine to exit the body more freely.  Please take this once daily with a meal.  -Percocet-this is a narcotic/controlled substance medication that has potential addicting qualities.  We recommend that you take 1-2 tablets every 6 hours as needed for severe pain.  Do not drive or operate heavy machinery when taking this medicine as it can be sedating. Do not drink alcohol or take other sedating medications when taking this medicine for safety reasons.  Keep this out of reach of small children.  Please be aware this medicine has Tylenol in it (325 mg/tab) do not exceed the maximum dose of Tylenol in a day per over the counter recommendations should you decide to supplement with Tylenol over the counter.   -Zofran-this is an antinausea medication, you may take this every 8 hours as needed for nausea and vomiting, please allow the tablet to dissolve underneath of your tongue.   We have prescribed you new medication(s) today. Discuss the medications prescribed today with your pharmacist as they can have adverse effects and interactions with your other medicines including over the counter and prescribed medications. Seek medical evaluation if you start to experience new or abnormal symptoms after taking one of these medicines, seek care immediately if you start to experience difficulty breathing, feeling of your throat closing, facial swelling, or rash as these could be indications of a more serious allergic reaction  Please follow-up with the urology group provided in your discharge instructions within 3 to 5 days.  Return to  the ER for new or worsening symptoms including but not limited to worsening pain not controlled by these medicines, inability to keep fluids down, fever, or any other concerns that you may have.

## 2020-09-14 ENCOUNTER — Other Ambulatory Visit: Payer: Self-pay

## 2020-09-14 ENCOUNTER — Emergency Department (HOSPITAL_COMMUNITY): Payer: BLUE CROSS/BLUE SHIELD

## 2020-09-14 ENCOUNTER — Encounter (HOSPITAL_COMMUNITY): Payer: Self-pay | Admitting: Emergency Medicine

## 2020-09-14 ENCOUNTER — Emergency Department (HOSPITAL_COMMUNITY)
Admission: EM | Admit: 2020-09-14 | Discharge: 2020-09-14 | Disposition: A | Discharge status: Home / Self Care | Payer: BLUE CROSS/BLUE SHIELD | Attending: Emergency Medicine | Admitting: Emergency Medicine

## 2020-09-14 DIAGNOSIS — K047 Periapical abscess without sinus: Secondary | ICD-10-CM | POA: Diagnosis not present

## 2020-09-14 DIAGNOSIS — F17210 Nicotine dependence, cigarettes, uncomplicated: Secondary | ICD-10-CM | POA: Insufficient documentation

## 2020-09-14 DIAGNOSIS — K0889 Other specified disorders of teeth and supporting structures: Secondary | ICD-10-CM | POA: Diagnosis present

## 2020-09-14 LAB — CBC WITH DIFFERENTIAL/PLATELET
Abs Immature Granulocytes: 0.03 10*3/uL (ref 0.00–0.07)
Basophils Absolute: 0 10*3/uL (ref 0.0–0.1)
Basophils Relative: 0 %
Eosinophils Absolute: 0 10*3/uL (ref 0.0–0.5)
Eosinophils Relative: 1 %
HCT: 34.9 % — ABNORMAL LOW (ref 39.0–52.0)
Hemoglobin: 12.1 g/dL — ABNORMAL LOW (ref 13.0–17.0)
Immature Granulocytes: 0 %
Lymphocytes Relative: 19 %
Lymphs Abs: 1.5 10*3/uL (ref 0.7–4.0)
MCH: 25.6 pg — ABNORMAL LOW (ref 26.0–34.0)
MCHC: 34.7 g/dL (ref 30.0–36.0)
MCV: 73.9 fL — ABNORMAL LOW (ref 80.0–100.0)
Monocytes Absolute: 0.7 10*3/uL (ref 0.1–1.0)
Monocytes Relative: 9 %
Neutro Abs: 5.3 10*3/uL (ref 1.7–7.7)
Neutrophils Relative %: 71 %
Platelets: 250 10*3/uL (ref 150–400)
RBC: 4.72 MIL/uL (ref 4.22–5.81)
RDW: 13.7 % (ref 11.5–15.5)
WBC: 7.5 10*3/uL (ref 4.0–10.5)
nRBC: 0 % (ref 0.0–0.2)

## 2020-09-14 LAB — BASIC METABOLIC PANEL
Anion gap: 8 (ref 5–15)
BUN: 12 mg/dL (ref 6–20)
CO2: 28 mmol/L (ref 22–32)
Calcium: 9 mg/dL (ref 8.9–10.3)
Chloride: 100 mmol/L (ref 98–111)
Creatinine, Ser: 1.11 mg/dL (ref 0.61–1.24)
GFR, Estimated: >60 mL/min (ref >60)
Glucose, Bld: 125 mg/dL — ABNORMAL HIGH (ref 70–99)
Potassium: 4 mmol/L (ref 3.5–5.1)
Sodium: 136 mmol/L (ref 135–145)

## 2020-09-14 MED ORDER — CLINDAMYCIN HCL 150 MG PO CAPS: 450.0000 mg | ORAL_CAPSULE | Freq: Three times a day (TID) | ORAL | 0 refills | Status: AC

## 2020-09-14 MED ORDER — MORPHINE SULFATE (PF) 4 MG/ML IV SOLN
4.0000 mg | Freq: Once | INTRAVENOUS | Status: DC
  Filled 2020-09-14: qty 1

## 2020-09-14 MED ORDER — MORPHINE SULFATE (PF) 2 MG/ML IV SOLN: 2.0000 mg | Freq: Once | INTRAVENOUS | Status: DC

## 2020-09-14 MED ORDER — IOHEXOL 300 MG/ML  SOLN
100.0000 mL | Freq: Once | INTRAMUSCULAR | Status: AC | PRN
  Administered 2020-09-14: 100 mL via INTRAVENOUS

## 2020-09-14 MED ORDER — OXYCODONE-ACETAMINOPHEN 5-325 MG PO TABS
1.0000 | ORAL_TABLET | Freq: Once | ORAL | Status: AC
  Administered 2020-09-14: 1 via ORAL
  Filled 2020-09-14: qty 1

## 2020-09-14 NOTE — ED Notes (Signed)
Patient demands his IV being taken out. States "I came here for one pain and I now have two. I want this IV out." IV taken out by this NT

## 2020-09-14 NOTE — Discharge Instructions (Addendum)
We discussed the results of your CT maxillofacial.  I have provided antibiotics for your dental infection.Please take 3 capsules three times a day for the next 7 days.If you experience any fever, difficulty swallowing, voice changes or shortness of breath please return to the ED for reevaluation. I have also prescribed pain medication, please take for severe pain as needed.   You may follow up with your dentist after completion of antibiotic therapy.

## 2020-09-14 NOTE — ED Notes (Signed)
Pt verbalizes understanding of d/c instructions. Prescriptions reviewed with patient. Pt ambulatory at d/c with all belongings. Pt's brother coming to pick him up for ride home.

## 2020-09-14 NOTE — ED Triage Notes (Signed)
Patient presents with possible abscess to the right maxillary area involving his jaw. The area is painful and tender to touch. He reports a few days fevers. He was using ibuprofen and mouth rinses without relief. Marland Kitchen

## 2020-09-14 NOTE — ED Provider Notes (Signed)
Emergency Medicine Provider Triage Evaluation Note  Jason Flynn , a 35 y.o. male  was evaluated in triage.  Pt complains of dental pain on the right side of his mouth as well as difficulty opening and closing his mouth.  Some sore throat as well. Symptoms of been ongoing for 2 days.  He states they have been progressively worsening and he states he can now no longer open his mouth.    Review of Systems  Positive: Dental pain, sore throat, difficulty opening his mouth Negative: Fever  Physical Exam  BP 135/85   Pulse 77   Temp 98.6 F (37 C)   Resp 16   SpO2 99%  Gen:   Awake, no distress  Resp:  Normal effort  MSK:   Moves extremities without difficulty  Other:  Trismus present on examination.  Difficult to evaluate oral cavity for this reason.  Significant pain w exam.   Medical Decision Making  Medically screening exam initiated at 5:56 PM.  Appropriate orders placed.  Jason Flynn was informed that the remainder of the evaluation will be completed by another provider, this initial triage assessment does not replace that evaluation, and the importance of remaining in the ED until their evaluation is complete.  Concern for Ludwick's versus deep space infection of the mouth  Will obtain CT soft tissue neck with contrast CBC, BMP and provide with 1 dose of IM morphine   Gailen Shelter, [place] 09/14/20 1801    Eber Hong, MD 09/14/20 2138

## 2020-09-14 NOTE — ED Provider Notes (Signed)
[facility] EMERGENCY DEPARTMENT Provider Note   CSN: [id number] Arrival date & time: 09/14/20  1728     History Chief Complaint  Patient presents with   Oral Swelling   Abscess    Jason Flynn is a 35 y.o. male.  36 y.o male with no PMH presents to the ED with a chief complaint of dental pain x 2 days. Patient reports his first originated 2 days ago, he has been taken over-the-counter medication without much improvement in symptoms.  He feels like the symptoms progress worsen, he is no longer able to open his mouth as he reports there is pain with mastication.  He is tolerating his secretions, without any fever.  No prior similar episodes to this.  The history is provided by the patient.  Abscess Location:  Mouth Associated symptoms: no fever, no headaches and no vomiting       History reviewed. No pertinent past medical history.  There are no problems to display for this patient.   History reviewed. No pertinent surgical history.     History reviewed. No pertinent family history.  Social History   Tobacco Use   Smoking status: Every Day    Packs/day: 0.50    Pack years: 0.00    Types: Cigarettes   Smokeless tobacco: Never  Substance Use Topics   Alcohol use: No   Drug use: Yes    Types: Marijuana    Comment: everyday    Home Medications Prior to Admission medications   Medication Sig Start Date End Date Taking? Authorizing Provider  clindamycin (CLEOCIN) 150 MG capsule Take 3 capsules (450 mg total) by mouth 3 (three) times daily for 7 days. 09/14/20 09/21/20 Yes Jekhi Bolin, PA-C  ibuprofen (ADVIL) 200 MG tablet Take 800 mg by mouth every 6 (six) hours as needed for moderate pain.   Yes [provider]  ibuprofen (ADVIL,MOTRIN) 800 MG tablet Take 1 tablet (800 mg total) by mouth every 8 (eight) hours as needed. Patient not taking: No sig reported 10/14/16   Maia Plan, MD  ondansetron (ZOFRAN ODT) 4 MG disintegrating tablet  4mg  ODT q4 hours prn nausea/vomit Patient not taking: No sig reported 08/31/20   09/02/20, PA-C  oxyCODONE-acetaminophen (PERCOCET) 5-325 MG tablet Take 1 tablet by mouth every 8 (eight) hours as needed. Patient not taking: No sig reported 08/31/20   09/02/20, PA-C    Allergies    Patient has no known allergies.  Review of Systems   Review of Systems  Constitutional:  Negative for chills and fever.  HENT:  Positive for dental problem. Negative for sore throat.   Respiratory:  Negative for chest tightness and shortness of breath.   Cardiovascular:  Negative for chest pain.  Gastrointestinal:  Negative for abdominal pain and vomiting.  Genitourinary:  Negative for flank pain.  Musculoskeletal:  Negative for back pain.  Neurological:  Negative for light-headedness and headaches.  All other systems reviewed and are negative.  Physical Exam Updated Vital Signs BP 134/81 (BP Location: Right Arm)   Pulse 62   Temp 98.6 F (37 C)   Resp 16   Ht 5\' 5"  (1.651 m)   Wt 72.6 kg   SpO2 100%   BMI 26.63 kg/m   Physical Exam Vitals and nursing note reviewed.  Constitutional:      Appearance: Normal appearance.     Comments: Appears very uncomfortable.  HENT:     Head: Normocephalic and atraumatic.  Mouth/Throat:     Mouth: Mucous membranes are moist.     Comments: Poor dentition throughout, visible abscess noted to the left upper gumline, palpable pain on exam.  No trismus. Eyes:     Pupils: Pupils are equal, round, and reactive to light.  Cardiovascular:     Rate and Rhythm: Normal rate.  Pulmonary:     Effort: Pulmonary effort is normal.     Breath sounds: No wheezing.  Abdominal:     General: Abdomen is flat.     Tenderness: There is no abdominal tenderness. There is no right CVA tenderness or left CVA tenderness.  Musculoskeletal:     Cervical back: Normal range of motion and neck supple.  Skin:    General: Skin is warm and dry.  Neurological:     Mental  Status: He is alert and oriented to person, place, and time.    ED Results / Procedures / Treatments   Labs (all labs ordered are listed, but only abnormal results are displayed) Labs Reviewed  CBC WITH DIFFERENTIAL/PLATELET - Abnormal; Notable for the following components:      Result Value   Hemoglobin 12.1 (*)    HCT 34.9 (*)    MCV 73.9 (*)    MCH 25.6 (*)    All other components within normal limits  BASIC METABOLIC PANEL - Abnormal; Notable for the following components:   Glucose, Bld 125 (*)    All other components within normal limits    EKG None  Radiology No results found.  Procedures Procedures   Medications Ordered in ED Medications  morphine 4 MG/ML injection 4 mg (4 mg Intramuscular Patient Refused/Not Given 09/14/20 1821)  iohexol (OMNIPAQUE) 300 MG/ML solution 100 mL (100 mLs Intravenous Contrast Given 09/14/20 1958)    ED Course  I have reviewed the triage vital signs and the nursing notes.  Pertinent labs & imaging results that were available during my care of the patient were reviewed by me and considered in my medical decision making (see chart for details).  Clinical Course as of 09/14/20 2225  Fri Sep 14, 2020  2139 WBC: 7.5 [JS]    Clinical Course User Index [JS] Claude Manges, PA-C   MDM Rules/Calculators/A&P   Patient with apparent past medical history presents to the ED with a chief complaint of dental pain which began 2 days ago.  Reports the pain is severe in nature to the right upper gumline.  He has been trying over-the-counter medication without improvement in symptoms.  During primary evaluation patient is afebrile, he has difficulty opening his mouth however no significant trismus appreciated by me, able to visualize the uvula with tongue depressor.  Cervical adenopathy is noted.  Oropharynx is erythematous, poor dentition throughout, multiple missing teeth along with caries. Interpretation of his blood work reveal a CBC with no  leukocytosis, hemoglobin is at his baseline.  BMP without any electrolyte derangement.  A CT of his face was ordered in triage due to concern for Ludewig's angina.  CT Maxillofacial showed: 1. Rim enhancing 1.0 x 2.2 x 1.1 cm collection adjacent to the right maxilla, consistent with an odontogenic abscess. Associated regional cellulitis within the adjacent right face as above. No invasion into the floor of mouth or sublingual space to suggest Ludwig's at this time. 2. Mildly enlarged submental lymph nodes, likely reactive. 3. Poor dentition with innumerable dental caries and periapical lucencies about the teeth  These results were discussed at length with patient, we did discuss treatment with  antibiotic therapy along with ibuprofen for pain control.  He is agreeable of this at this time.  I advised him to follow-up with his symptoms, he does have a an affordable dental act card, and will schedule an appointment.  Return precautions discussed at length.  Patient stable for discharge.  Portions of this note were generated with Scientist, clinical (histocompatibility and immunogenetics). Dictation errors may occur despite best attempts at proofreading.  Final Clinical Impression(s) / ED Diagnoses Final diagnoses:  Dental abscess    Rx / DC Orders ED Discharge Orders          Ordered    clindamycin (CLEOCIN) 150 MG capsule  3 times daily        09/14/20 2217             Claude Manges, PA-C 09/14/20 2225    Tegeler, Canary Brim, MD 09/15/20 0030

## 2020-11-06 ENCOUNTER — Ambulatory Visit (HOSPITAL_COMMUNITY)
Admission: EM | Admit: 2020-11-06 | Discharge: 2020-11-06 | Disposition: A | Discharge status: Home / Self Care | Payer: Self-pay | Attending: Internal Medicine | Admitting: Internal Medicine

## 2020-11-06 ENCOUNTER — Encounter (HOSPITAL_COMMUNITY): Payer: Self-pay | Admitting: *Deleted

## 2020-11-06 ENCOUNTER — Other Ambulatory Visit: Payer: Self-pay

## 2020-11-06 DIAGNOSIS — U071 COVID-19: Secondary | ICD-10-CM | POA: Insufficient documentation

## 2020-11-06 MED ORDER — ACETAMINOPHEN 325 MG PO TABS
650.0000 mg | ORAL_TABLET | Freq: Once | ORAL | Status: AC
  Administered 2020-11-06: 650 mg via ORAL

## 2020-11-06 MED ORDER — ACETAMINOPHEN 325 MG PO TABS
ORAL_TABLET | ORAL | Status: AC
  Filled 2020-11-06: qty 2

## 2020-11-06 NOTE — ED Triage Notes (Signed)
Pt reports he wok up with HA ,chills and body aches.

## 2020-11-06 NOTE — ED Provider Notes (Signed)
[facility]    CSN: [id number] Arrival date & time: 11/06/20  1739      History   Chief Complaint Chief Complaint  Patient presents with   Headache   Chills   Generalized Body Aches    HPI Jason Flynn is a 35 y.o. male.   Patient here concerned with cough x today.  Admits fatigue, myalgia, nasal congestion, rhinorrhea, ST.  His sister has COVID, whom he lives with.  No advil or tylenol today.  He has not had COVID vaccine.   History reviewed. No pertinent past medical history.  There are no problems to display for this patient.   History reviewed. No pertinent surgical history.     Home Medications    Prior to Admission medications   Medication Sig Start Date End Date Taking? Authorizing Provider  ibuprofen (ADVIL) 200 MG tablet Take 800 mg by mouth every 6 (six) hours as needed for moderate pain.    [provider]  ibuprofen (ADVIL,MOTRIN) 800 MG tablet Take 1 tablet (800 mg total) by mouth every 8 (eight) hours as needed. Patient not taking: No sig reported 10/14/16   Maia Plan, MD  ondansetron (ZOFRAN ODT) 4 MG disintegrating tablet 4mg  ODT q4 hours prn nausea/vomit Patient not taking: No sig reported 08/31/20   09/02/20, PA-C  oxyCODONE-acetaminophen (PERCOCET) 5-325 MG tablet Take 1 tablet by mouth every 8 (eight) hours as needed. Patient not taking: No sig reported 08/31/20   09/02/20    Family History History reviewed. No pertinent family history.  Social History Social History   Tobacco Use   Smoking status: Every Day    Packs/day: 0.50    Types: Cigarettes   Smokeless tobacco: Never  Substance Use Topics   Alcohol use: No   Drug use: Yes    Types: Marijuana    Comment: everyday     Allergies   Patient has no known allergies.   Review of Systems Review of Systems  Constitutional:  Positive for chills, fatigue and fever.  HENT:  Positive for congestion, rhinorrhea and sore throat. Negative  for ear pain, nosebleeds, postnasal drip, sinus pressure and sinus pain.   Eyes:  Negative for pain and redness.  Respiratory:  Positive for cough. Negative for shortness of breath and wheezing.   Gastrointestinal:  Negative for abdominal pain, diarrhea, nausea and vomiting.  Musculoskeletal:  Negative for arthralgias and myalgias.  Skin:  Negative for rash.  Neurological:  Negative for light-headedness and headaches.  Hematological:  Negative for adenopathy. Does not bruise/bleed easily.  Psychiatric/Behavioral:  Negative for confusion and sleep disturbance.     Physical Exam Triage Vital Signs ED Triage Vitals  Enc Vitals Group     BP 11/06/20 1820 120/68     Pulse Rate 11/06/20 1820 68     Resp 11/06/20 1820 20     Temp 11/06/20 1820 (!) 102.5 F (39.2 C)     Temp Source 11/06/20 1820 Oral     SpO2 11/06/20 1820 94 %     Weight --      Height --      Head Circumference --      Peak Flow --      Pain Score 11/06/20 1822 10     Pain Loc --      Pain Edu? --      Excl. in GC? --    No data found.  Updated Vital Signs BP 120/68  Pulse 68   Temp (!) 102.5 F (39.2 C) (Oral)   Resp 20   SpO2 94%   Visual Acuity Right Eye Distance:   Left Eye Distance:   Bilateral Distance:    Right Eye Near:   Left Eye Near:    Bilateral Near:     Physical Exam Vitals and nursing note reviewed.  Constitutional:      General: He is not in acute distress.    Appearance: Normal appearance. He is not ill-appearing.  HENT:     Head: Normocephalic and atraumatic.     Right Ear: Tympanic membrane and ear canal normal.     Left Ear: Tympanic membrane and ear canal normal.     Nose: No congestion or rhinorrhea.     Mouth/Throat:     Pharynx: Uvula midline. No oropharyngeal exudate or posterior oropharyngeal erythema.     Tonsils: No tonsillar exudate or tonsillar abscesses.  Eyes:     General: No scleral icterus.    Extraocular Movements: Extraocular movements intact.     Left  eye: Normal extraocular motion.     Conjunctiva/sclera: Conjunctivae normal.     Pupils: Pupils are equal.  Cardiovascular:     Rate and Rhythm: Normal rate and regular rhythm.     Heart sounds: No murmur heard. Pulmonary:     Effort: Pulmonary effort is normal. No respiratory distress.     Breath sounds: Normal breath sounds. No wheezing or rales.  Musculoskeletal:     Cervical back: Normal range of motion. No rigidity.  Lymphadenopathy:     Cervical: Cervical adenopathy present.     Right cervical: Superficial cervical adenopathy present.     Left cervical: Superficial cervical adenopathy present.  Skin:    Coloration: Skin is not jaundiced.     Findings: No rash.  Neurological:     General: No focal deficit present.     Mental Status: He is alert and oriented to person, place, and time.     Motor: No weakness.     Gait: Gait normal.  Psychiatric:        Mood and Affect: Mood normal.        Behavior: Behavior normal.     UC Treatments / Results  Labs (all labs ordered are listed, but only abnormal results are displayed) Labs Reviewed  SARS CORONAVIRUS 2 (TAT 6-24 HRS)    EKG   Radiology No results found.  Procedures Procedures (including critical care time)  Medications Ordered in UC Medications  acetaminophen (TYLENOL) tablet 650 mg (650 mg Oral Given 11/06/20 1828)    Initial Impression / Assessment and Plan / UC Course  I have reviewed the triage vital signs and the nursing notes.  Pertinent labs & imaging results that were available during my care of the patient were reviewed by me and considered in my medical decision making (see chart for details).     Remain in isolation as discussed. Take ibuprofen or tylenol as needed COVID test results available via mychart in 24 - 48 hours. Final Clinical Impressions(s) / UC Diagnoses   Final diagnoses:  COVID-19 determined by clinical diagnostic criteria     Discharge Instructions      Remain in  isolation as discussed     ED Prescriptions   None    PDMP not reviewed this encounter.   Evern Core, PA-C 11/06/20 1945

## 2020-11-06 NOTE — Discharge Instructions (Addendum)
Remain in isolation as discussed

## 2020-11-07 LAB — SARS CORONAVIRUS 2 (TAT 6-24 HRS): SARS Coronavirus 2: POSITIVE — AB

## 2021-08-14 ENCOUNTER — Other Ambulatory Visit: Payer: Self-pay

## 2021-08-14 ENCOUNTER — Emergency Department (HOSPITAL_COMMUNITY)
Admission: EM | Admit: 2021-08-14 | Discharge: 2021-08-14 | Disposition: A | Discharge status: Home / Self Care | Payer: Self-pay | Attending: Emergency Medicine | Admitting: Emergency Medicine

## 2021-08-14 ENCOUNTER — Encounter (HOSPITAL_COMMUNITY): Payer: Self-pay

## 2021-08-14 ENCOUNTER — Emergency Department (HOSPITAL_COMMUNITY): Payer: Self-pay

## 2021-08-14 DIAGNOSIS — X58XXXA Exposure to other specified factors, initial encounter: Secondary | ICD-10-CM | POA: Insufficient documentation

## 2021-08-14 DIAGNOSIS — S8392XA Sprain of unspecified site of left knee, initial encounter: Secondary | ICD-10-CM | POA: Insufficient documentation

## 2021-08-14 MED ORDER — NAPROXEN 250 MG PO TABS
500.0000 mg | ORAL_TABLET | Freq: Once | ORAL | Status: AC
  Administered 2021-08-14: 500 mg via ORAL
  Filled 2021-08-14: qty 2

## 2021-08-14 MED ORDER — NAPROXEN 500 MG PO TABS: 500.0000 mg | ORAL_TABLET | Freq: Two times a day (BID) | ORAL | 0 refills | Status: DC

## 2021-08-14 NOTE — Progress Notes (Signed)
Orthopedic Tech Progress Note Patient Details:  Jason Flynn 1986/01/23 [id number]  Ortho Devices Type of Ortho Device: Knee Immobilizer, Crutches Ortho Device/Splint Location: LLE Ortho Device/Splint Interventions: Ordered, Application, Adjustment   Post Interventions Patient Tolerated: Poor Instructions Provided: Care of device, Adjustment of device Knee immobilizer applied, patient did not tolerate it well due to pain. Did not want me to show him how to use crutches and stated "I already know how".  Darleen Crocker 08/14/2021, 11:21 PM
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
## 2021-08-14 NOTE — Discharge Instructions (Addendum)
Please take Naprosyn, 500mg  by mouth twice daily as needed for pain - this in an antiinflammatory medicine (NSAID) and is similar to ibuprofen - many people feel that it is stronger than ibuprofen and it is easier to take since it is a smaller pill.  Please use this only for 1 week - if your pain persists, you will need to follow up with your doctor in the office for ongoing guidance and pain control.  Your x-rays are normal, no signs of broken bones, you probably have a knee sprain, read the attached instructions  Thank you for allowing to treat you in the emergency department today.  After reviewing your examination and potential testing that was done it appears that you are safe to go home.  I would like for you to follow-up with your doctor within the next several days, have them obtain your results and follow-up with them to review all of these tests.  If you should develop severe or worsening symptoms return to the emergency department immediately
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
## 2022-10-28 IMAGING — CR DG KNEE COMPLETE 4+V*L*
5 series · 5 of 5 positions shown · non-contrast
Comparison: 10/14/2016

CLINICAL DATA: Fall, left leg and knee pain.

EXAM:
LEFT KNEE - COMPLETE 4+ VIEW

[knee obl (1 of 3)]
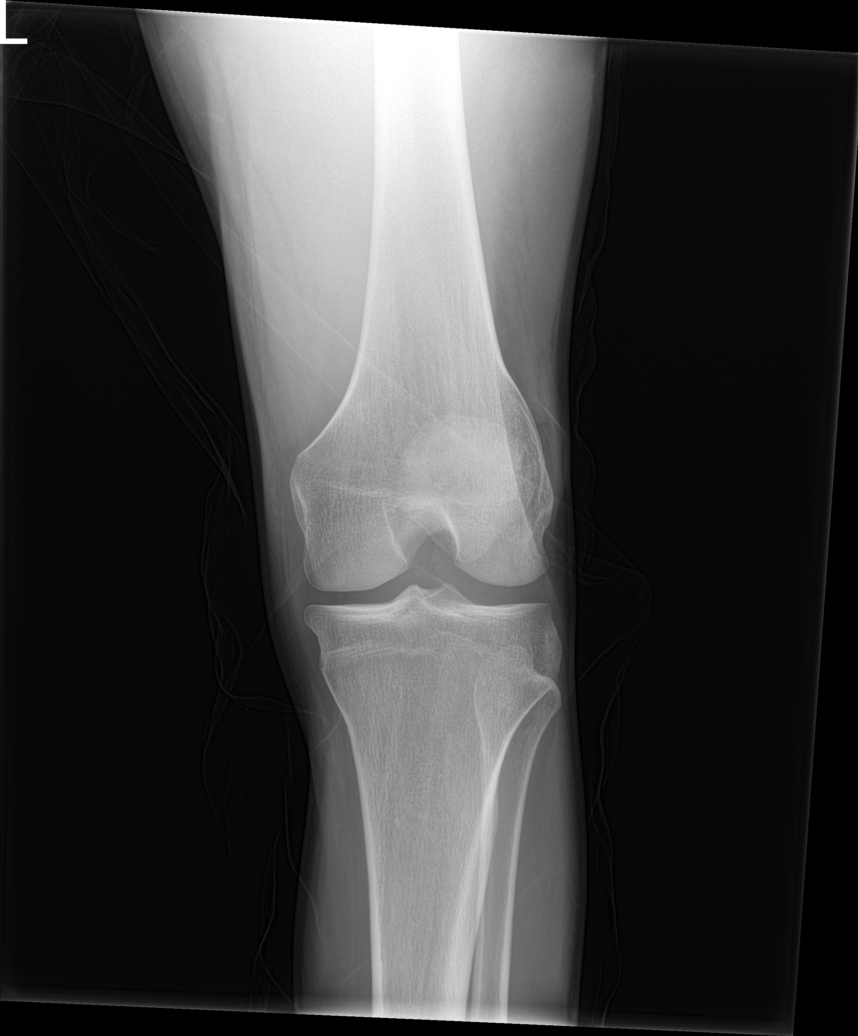

[knee ap]
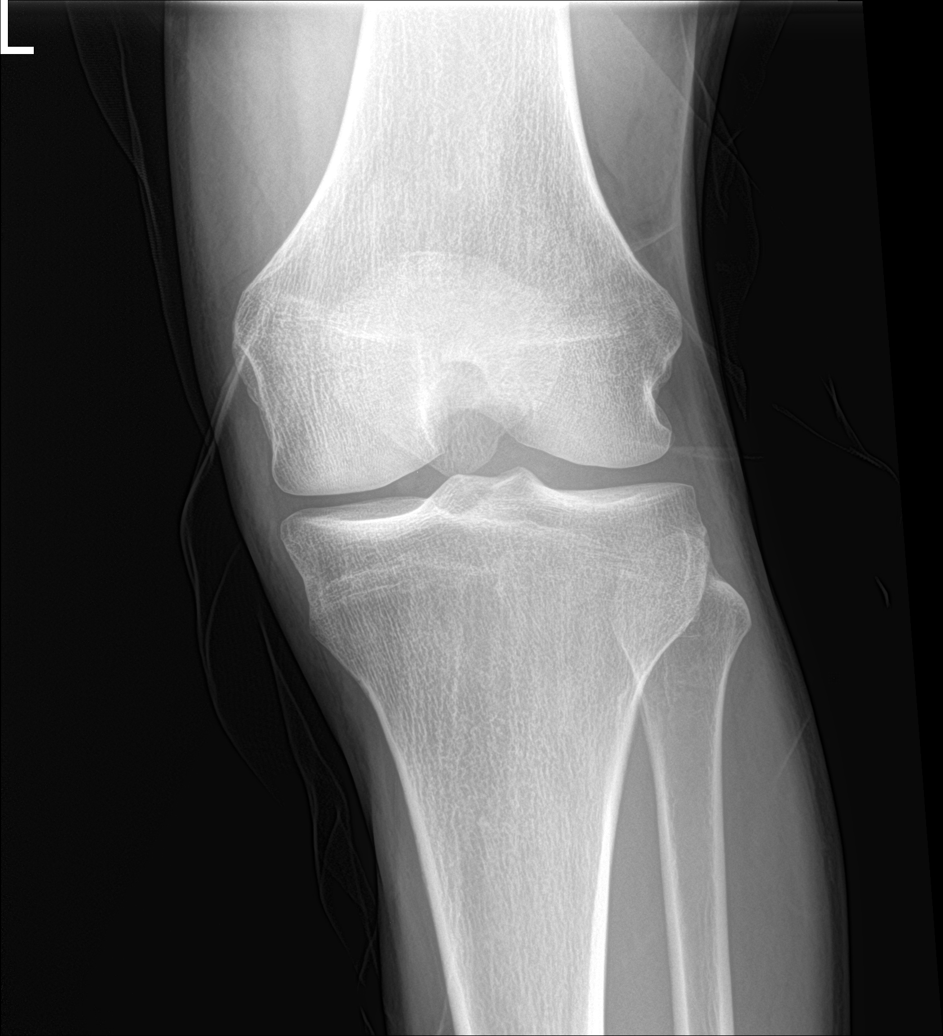

[knee obl (2 of 3)]
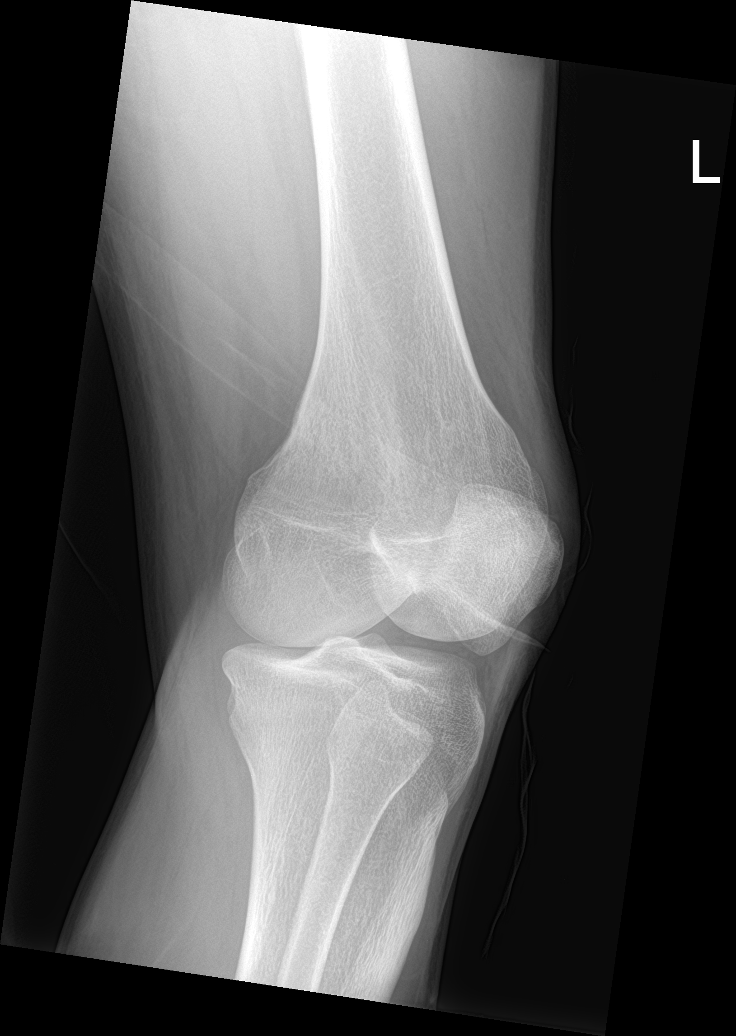

[knee lat]
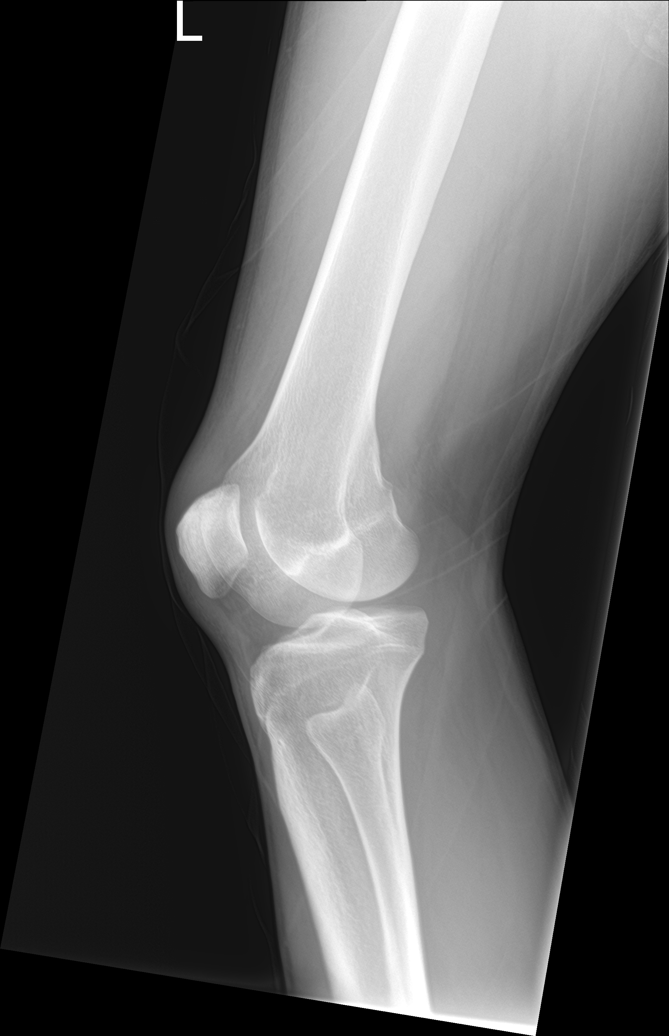

[knee obl (3 of 3)]
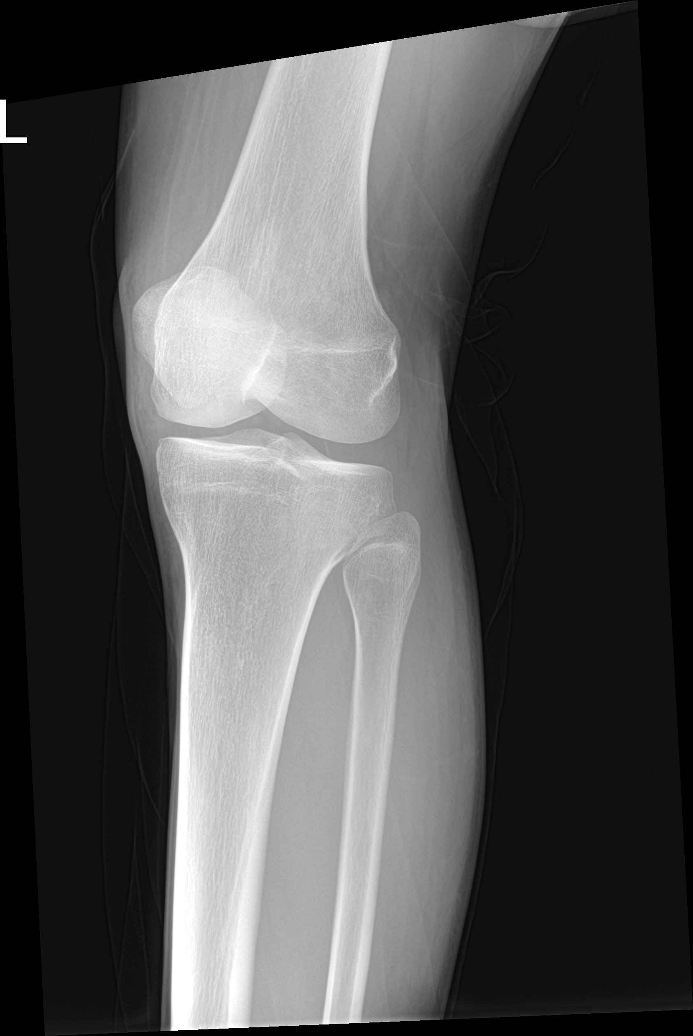

[5 of 5 positions shown; findings below may reference images not displayed]

FINDINGS: No acute fracture or dislocation. Joint space is maintained. No
joint effusion is identified. There is mild soft tissue swelling
anterior to the tibia.
IMPRESSION: No acute fracture or dislocation.

## 2022-11-23 ENCOUNTER — Observation Stay (HOSPITAL_COMMUNITY)
Admission: EM | Admit: 2022-11-23 | Discharge: 2022-11-24 | Discharge status: Against Medical Advice | Payer: Self-pay | Attending: Internal Medicine | Admitting: Internal Medicine

## 2022-11-23 ENCOUNTER — Encounter (HOSPITAL_COMMUNITY): Payer: Self-pay | Admitting: Emergency Medicine

## 2022-11-23 ENCOUNTER — Other Ambulatory Visit: Payer: Self-pay

## 2022-11-23 DIAGNOSIS — N4829 Other inflammatory disorders of penis: Secondary | ICD-10-CM

## 2022-11-23 DIAGNOSIS — N489 Disorder of penis, unspecified: Secondary | ICD-10-CM | POA: Diagnosis present

## 2022-11-23 DIAGNOSIS — N4889 Other specified disorders of penis: Principal | ICD-10-CM | POA: Insufficient documentation

## 2022-11-23 DIAGNOSIS — N342 Other urethritis: Secondary | ICD-10-CM | POA: Insufficient documentation

## 2022-11-23 DIAGNOSIS — Z209 Contact with and (suspected) exposure to unspecified communicable disease: Secondary | ICD-10-CM

## 2022-11-23 DIAGNOSIS — Z7252 High risk homosexual behavior: Secondary | ICD-10-CM | POA: Insufficient documentation

## 2022-11-23 LAB — CBC WITH DIFFERENTIAL/PLATELET
Abs Immature Granulocytes: 0 10*3/uL (ref 0.00–0.07)
Basophils Absolute: 0.2 10*3/uL — ABNORMAL HIGH (ref 0.0–0.1)
Basophils Relative: 3 %
Eosinophils Absolute: 0.2 10*3/uL (ref 0.0–0.5)
Eosinophils Relative: 4 %
HCT: 36.5 % — ABNORMAL LOW (ref 39.0–52.0)
Hemoglobin: 12.6 g/dL — ABNORMAL LOW (ref 13.0–17.0)
Lymphocytes Relative: 32 %
Lymphs Abs: 1.7 10*3/uL (ref 0.7–4.0)
MCH: 25.3 pg — ABNORMAL LOW (ref 26.0–34.0)
MCHC: 34.5 g/dL (ref 30.0–36.0)
MCV: 73.3 fL — ABNORMAL LOW (ref 80.0–100.0)
Monocytes Absolute: 0.2 10*3/uL (ref 0.1–1.0)
Monocytes Relative: 3 %
Neutro Abs: 3.1 10*3/uL (ref 1.7–7.7)
Neutrophils Relative %: 58 %
Platelets: 344 10*3/uL (ref 150–400)
RBC: 4.98 MIL/uL (ref 4.22–5.81)
RDW: 14 % (ref 11.5–15.5)
WBC: 5.4 10*3/uL (ref 4.0–10.5)
nRBC: 0 % (ref 0.0–0.2)
nRBC: 0 /100{WBCs}

## 2022-11-23 LAB — URINALYSIS, W/ REFLEX TO CULTURE (INFECTION SUSPECTED)
Glucose, UA: NEGATIVE mg/dL
Hgb urine dipstick: NEGATIVE
Ketones, ur: NEGATIVE mg/dL
Nitrite: NEGATIVE
Protein, ur: NEGATIVE mg/dL
Specific Gravity, Urine: 1.015 (ref 1.005–1.030)
pH: 7.5 (ref 5.0–8.0)

## 2022-11-23 LAB — COMPREHENSIVE METABOLIC PANEL
ALT: 12 U/L (ref 0–44)
AST: 17 U/L (ref 15–41)
Albumin: 3.1 g/dL — ABNORMAL LOW (ref 3.5–5.0)
Alkaline Phosphatase: 54 U/L (ref 38–126)
Anion gap: 14 (ref 5–15)
BUN: 10 mg/dL (ref 6–20)
CO2: 26 mmol/L (ref 22–32)
Calcium: 8.9 mg/dL (ref 8.9–10.3)
Chloride: 100 mmol/L (ref 98–111)
Creatinine, Ser: 1.06 mg/dL (ref 0.61–1.24)
GFR, Estimated: >60 mL/min (ref >60)
Glucose, Bld: 92 mg/dL (ref 70–99)
Potassium: 3.9 mmol/L (ref 3.5–5.1)
Sodium: 140 mmol/L (ref 135–145)
Total Bilirubin: 0.5 mg/dL (ref 0.3–1.2)
Total Protein: 8 g/dL (ref 6.5–8.1)

## 2022-11-23 LAB — HIV ANTIBODY (ROUTINE TESTING W REFLEX): HIV Screen 4th Generation wRfx: NONREACTIVE

## 2022-11-23 MED ORDER — ONDANSETRON HCL 4 MG/2ML IJ SOLN
4.0000 mg | Freq: Once | INTRAMUSCULAR | Status: AC
  Administered 2022-11-23: 4 mg via INTRAVENOUS
  Filled 2022-11-23: qty 2

## 2022-11-23 MED ORDER — DOXYCYCLINE HYCLATE 100 MG PO TABS
100.0000 mg | ORAL_TABLET | Freq: Two times a day (BID) | ORAL | Status: DC
  Administered 2022-11-24: 100 mg via ORAL
  Filled 2022-11-23: qty 1

## 2022-11-23 MED ORDER — ENOXAPARIN SODIUM 40 MG/0.4ML IJ SOSY: 40.0000 mg | PREFILLED_SYRINGE | INTRAMUSCULAR | Status: DC

## 2022-11-23 MED ORDER — MORPHINE SULFATE (PF) 4 MG/ML IV SOLN
4.0000 mg | Freq: Once | INTRAVENOUS | Status: AC
  Administered 2022-11-23: 4 mg via INTRAVENOUS
  Filled 2022-11-23: qty 1

## 2022-11-23 MED ORDER — SODIUM CHLORIDE 0.9 % IV SOLN
1.0000 g | INTRAVENOUS | Status: DC
  Administered 2022-11-24: 1 g via INTRAVENOUS
  Filled 2022-11-23: qty 10

## 2022-11-23 MED ORDER — SODIUM CHLORIDE 0.9 % IV SOLN
2.0000 g | Freq: Once | INTRAVENOUS | Status: AC
  Administered 2022-11-23: 2 g via INTRAVENOUS
  Filled 2022-11-23: qty 12.5

## 2022-11-23 MED ORDER — AZITHROMYCIN 500 MG PO TABS
500.0000 mg | ORAL_TABLET | Freq: Every day | ORAL | Status: DC
  Administered 2022-11-23 – 2022-11-24 (×2): 500 mg via ORAL
  Filled 2022-11-23: qty 2
  Filled 2022-11-23: qty 1

## 2022-11-23 MED ORDER — PENICILLIN G BENZATHINE 1200000 UNIT/2ML IM SUSY
2.4000 10*6.[IU] | PREFILLED_SYRINGE | Freq: Once | INTRAMUSCULAR | Status: DC
  Filled 2022-11-23: qty 4

## 2022-11-23 MED ORDER — DOXYCYCLINE HYCLATE 100 MG PO TABS
100.0000 mg | ORAL_TABLET | Freq: Once | ORAL | Status: AC
  Administered 2022-11-23: 100 mg via ORAL
  Filled 2022-11-23: qty 1

## 2022-11-23 NOTE — ED Notes (Signed)
Attempted to educate/inform Pt of meds. Pt refused. DO arrived and also attempted same.

## 2022-11-23 NOTE — ED Notes (Signed)
Wet to dry dressing reapplied to wound on penis per admitting orders.

## 2022-11-23 NOTE — ED Provider Notes (Signed)
[place] EM[facility]GENCY DEPARTMENT AT [facility] Provider Note   CSN: [id number] Arrival date & time: 11/23/22  1655     History  Chief Complaint  Patient presents with   Penis Pain    Jason Flynn is a 37 y.o. male.  This is a 37 year old male here today for penile pain.  Patient says that 4 weeks ago, he noticed a "scab "on his penis.  He says that he peeled it off.  He says that since then has been having increasing swelling, and discharge from his penis.  Patient has sex with women.  He does not often use condoms.  He has multiple sexual partners.  Does endorse having chills.   Penis Pain       Home Medications Prior to Admission medications   Medication Sig Start Date End Date Taking? Authorizing Provider  naproxen (NAPROSYN) 500 MG tablet Take 1 tablet (500 mg total) by mouth 2 (two) times daily with a meal. 08/14/21   Eber Hong, MD      Allergies    Patient has no known allergies.    Review of Systems   Review of Systems  Genitourinary:  Positive for penile pain.    Physical Exam Updated Vital Signs BP 121/77   Pulse 80   Temp 98 F (36.7 C) (Oral)   Resp 15   Ht 5\' 7"  (1.702 m)   Wt 72.6 kg   SpO2 98%   BMI 25.06 kg/m  Physical Exam Constitutional:      Appearance: He is not toxic-appearing.  Genitourinary:    Comments: Patient with edema from the base of the penis up to the glans.  He has erythema at the urethral meatus.  There is an ulceration.  There is pus and purulence extending from the urethral meatus.  Patient endorses urine stream coming out of lesion at the inferior portion of the urethral meatus. Neurological:     Mental Status: He is alert.     ED Results / Procedures / Treatments   Labs (all labs ordered are listed, but only abnormal results are displayed) Labs Reviewed  COMPREHENSIVE METABOLIC PANEL - Abnormal; Notable for the following components:      Result Value   Albumin 3.1 (*)    All other components  within normal limits  CBC WITH DIFFERENTIAL/PLATELET - Abnormal; Notable for the following components:   Hemoglobin 12.6 (*)    HCT 36.5 (*)    MCV 73.3 (*)    MCH 25.3 (*)    Basophils Absolute 0.2 (*)    All other components within normal limits  CULTURE, BLOOD (ROUTINE X 2)  CULTURE, BLOOD (ROUTINE X 2)  HIV ANTIBODY (ROUTINE TESTING W REFLEX)  RPR  URINALYSIS, W/ REFLEX TO CULTURE (INFECTION SUSPECTED)  GC/CHLAMYDIA PROBE AMP () NOT AT [facility]    EKG None  Radiology No results found.  Procedures Procedures    Medications Ordered in ED Medications  penicillin g benzathine (BICILLIN LA) 1200000 UNIT/2ML injection 2.4 Million Units (2.4 Million Units Intramuscular Patient Refused/Not Given 11/23/22 1843)  doxycycline (VIBRA-TABS) tablet 100 mg (has no administration in time range)  ceFEPIme (MAXIPIME) 2 g in sodium chloride 0.9 % 100 mL IVPB (0 g Intravenous Stopped 11/23/22 1933)  morphine (PF) 4 MG/ML injection 4 mg (4 mg Intravenous Given 11/23/22 1928)  ondansetron (ZOFRAN) injection 4 mg (4 mg Intravenous Given 11/23/22 1935)    ED Course/ Medical Decision Making/ A&P  Medical Decision Making 37 year old male here today for penile infection.  Plan-this is an alarming infectious urological process.  I have ordered an RPR, STI testing.  I am going to prophylactically treat the patient for syphilis, although the necrosis that he seems to be having at the base of the urethral meatus is a little bit less likely.  I am providing patient with cefepime as well for broader coverage against both STIs and other organisms.  Have placed urology consultation.  Lower suspicion for necrotizing infection.  Reassessment-urology's, evaluated the patient.  They agree with admission on the patient.  At this time, unclear the source of the patient's infection.  RPR still pending, gonorrhea chlamydia pending.  Will add in doxycycline for better STI  coverage.  Patient is currently refusing his IM penicillin because he has a fear of needles.  I discussed with the patient how this would be the only treatment if his RPR came back positive.  Amount and/or Complexity of Data Reviewed Labs: ordered.  Risk Prescription drug management.           Final Clinical Impression(s) / ED Diagnoses Final diagnoses:  Penile swelling  Urethritis    Rx / DC Orders ED Discharge Orders     None         Arletha Pili, DO 11/23/22 2031

## 2022-11-23 NOTE — ED Triage Notes (Addendum)
Pt reports he picked a scab on the head of his penis 2 weeks ago. Pt reports since that time "the sore has been eating into the meat part." Pt reports when he urinates "the urine comes out of both holes."

## 2022-11-23 NOTE — ED Notes (Signed)
ED TO INPATIENT HANDOFF REPORT  ED Nurse Name and Phone #: Ashely and Catha Brow S Name/Age/Gender Jason Flynn 37 y.o. male Room/Bed: 030C/030C  Code Status   Code Status: Full Code  Home/SNF/Other Home Patient oriented to: self, place, time, and situation Is this baseline? Yes   Triage Complete: Triage complete  Chief Complaint Penile lesion [N48.9]  Triage Note Pt reports he picked a scab on the head of his penis 2 weeks ago. Pt reports since that time "the sore has been eating into the meat part." Pt reports when he urinates "the urine comes out of both holes."    Allergies No Known Allergies  Level of Care/Admitting Diagnosis ED Disposition     ED Disposition  Admit   Condition  --   Comment  Hospital Area: [facility] [[id number]]  Level of Care: Med-Surg [16]  May place patient in observation at [facility] or [facility] if equivalent level of care is available:: No  Covid Evaluation: Asymptomatic - no recent exposure (last 10 days) testing not required  Diagnosis: Penile lesion [295621]  Admitting Physician: Dolly Rias [[id number]]  Attending Physician: Dolly Rias [[id number]]          B Medical/Surgery History History reviewed. No pertinent past medical history. History reviewed. No pertinent surgical history.   A IV Location/Drains/Wounds Patient Lines/Drains/Airways Status     Active Line/Drains/Airways     Name Placement date Placement time Site Days   Peripheral IV 11/23/22 18 G Right Antecubital 11/23/22  1840  Antecubital  less than 1            Intake/Output Last 24 hours No intake or output data in the 24 hours ending 11/23/22 2137  Labs/Imaging Results for orders placed or performed during the hospital encounter of 11/23/22 (from the past 48 hour(s))  Comprehensive metabolic panel     Status: Abnormal   Collection Time: 11/23/22  6:53 PM  Result Value Ref Range   Sodium 140 135 - 145 mmol/L    Potassium 3.9 3.5 - 5.1 mmol/L   Chloride 100 98 - 111 mmol/L   CO2 26 22 - 32 mmol/L   Glucose, Bld 92 70 - 99 mg/dL    Comment: Glucose reference range applies only to samples taken after fasting for at least 8 hours.   BUN 10 6 - 20 mg/dL   Creatinine, Ser 2.84 0.61 - 1.24 mg/dL   Calcium 8.9 8.9 - 13.2 mg/dL   Total Protein 8.0 6.5 - 8.1 g/dL   Albumin 3.1 (L) 3.5 - 5.0 g/dL   AST 17 15 - 41 U/L   ALT 12 0 - 44 U/L   Alkaline Phosphatase 54 38 - 126 U/L   Total Bilirubin 0.5 0.3 - 1.2 mg/dL   GFR, Estimated >44 >01 mL/min    Comment: (NOTE) Calculated using the CKD-EPI Creatinine Equation (2021)    Anion gap 14 5 - 15    Comment: Performed at [facility], [address] [place], [place] [address]  CBC with Differential     Status: Abnormal   Collection Time: 11/23/22  6:53 PM  Result Value Ref Range   WBC 5.4 4.0 - 10.5 K/uL   RBC 4.98 4.22 - 5.81 MIL/uL   Hemoglobin 12.6 (L) 13.0 - 17.0 g/dL   HCT 36.6 (L) 44.0 - 34.7 %   MCV 73.3 (L) 80.0 - 100.0 fL   MCH 25.3 (L) 26.0 - 34.0 pg   MCHC 34.5  30.0 - 36.0 g/dL   RDW 16.1 09.6 - 04.5 %   Platelets 344 150 - 400 K/uL   nRBC 0.0 0.0 - 0.2 %   Neutrophils Relative % 58 %   Neutro Abs 3.1 1.7 - 7.7 K/uL   Lymphocytes Relative 32 %   Lymphs Abs 1.7 0.7 - 4.0 K/uL   Monocytes Relative 3 %   Monocytes Absolute 0.2 0.1 - 1.0 K/uL   Eosinophils Relative 4 %   Eosinophils Absolute 0.2 0.0 - 0.5 K/uL   Basophils Relative 3 %   Basophils Absolute 0.2 (H) 0.0 - 0.1 K/uL   nRBC 0 0 /100 WBC   Abs Immature Granulocytes 0.00 0.00 - 0.07 K/uL   Target Cells PRESENT     Comment: Performed at [facility], [address], [place], [place] [address]  HIV Antibody (routine testing w rflx)     Status: None   Collection Time: 11/23/22  6:53 PM  Result Value Ref Range   HIV Screen 4th Generation wRfx Non Reactive Non Reactive    Comment: Performed at [facility], [address], [place], [place] [address]   Urinalysis, w/ Reflex to Culture (Infection Suspected) -Urine, Clean Catch     Status: Abnormal   Collection Time: 11/23/22  8:22 PM  Result Value Ref Range   Specimen Source URINE, CLEAN CATCH    Color, Urine YELLOW YELLOW   APPearance HAZY (A) CLEAR   Specific Gravity, Urine 1.015 1.005 - 1.030   pH 7.5 5.0 - 8.0   Glucose, UA NEGATIVE NEGATIVE mg/dL   Hgb urine dipstick NEGATIVE NEGATIVE   Bilirubin Urine SMALL (A) NEGATIVE   Ketones, ur NEGATIVE NEGATIVE mg/dL   Protein, ur NEGATIVE NEGATIVE mg/dL   Nitrite NEGATIVE NEGATIVE   Leukocytes,Ua SMALL (A) NEGATIVE   Squamous Epithelial / HPF 0-5 0 - 5 /HPF   WBC, UA 21-50 0 - 5 WBC/hpf    Comment: Reflex urine culture not performed if WBC <=10, OR if Squamous epithelial cells >5. If Squamous epithelial cells >5, suggest recollection.   RBC / HPF 0-5 0 - 5 RBC/hpf   Bacteria, UA RARE (A) NONE SEEN   Mucus PRESENT    Triple Phosphate Crystal PRESENT     Comment: Performed at [facility], [address] [place], [place] [address]   No results found.  Pending Labs Unresulted Labs (From admission, onward)     Start     Ordered   11/24/22 0500  CBC  Tomorrow morning,   R        11/23/22 2125   11/24/22 0500  Hepatitis B core antibody, total  Tomorrow morning,   R        11/23/22 2132   11/24/22 0500  Hepatitis B surface antibody,qualitative  Tomorrow morning,   R        11/23/22 2132   11/24/22 0500  Hepatitis B surface antigen  Tomorrow morning,   R        11/23/22 2132   11/24/22 0500  Hepatitis C antibody  Tomorrow morning,   R        11/23/22 2132   11/23/22 2137  Wet prep, genital  Once,   R       Comments: Penile swab    11/23/22 2136   11/23/22 2130  HIV Antibody (routine testing w rflx)  (HIV Antibody (Routine testing w reflex) panel)  Once,   R  11/23/22 2129   11/23/22 2130  .HSV 1/2 PCR, SURFACE (performed in [facility] lab)  Once,   R       Comments: Penile ulcer    11/23/22 2130   11/23/22 2022  Urine  Culture  Once,   R        11/23/22 2022   11/23/22 1755  Culture, blood (routine x 2)  BLOOD CULTURE X 2,   R (with STAT occurrences)      11/23/22 1754   11/23/22 1754  RPR  Once,   URGENT        11/23/22 1754            Vitals/Pain Today's Vitals   11/23/22 1900 11/23/22 1908 11/23/22 2045 11/23/22 2115  BP: 121/77  (!) 128/93 (!) 142/91  Pulse:   67 76  Resp:    17  Temp:      TempSrc:      SpO2:   100% 100%  Weight:      Height:      PainSc:  0-No pain      Isolation Precautions No active isolations  Medications Medications  penicillin g benzathine (BICILLIN LA) 1200000 UNIT/2ML injection 2.4 Million Units (2.4 Million Units Intramuscular Patient Refused/Not Given 11/23/22 1843)  enoxaparin (LOVENOX) injection 40 mg (has no administration in time range)  cefTRIAXone (ROCEPHIN) 1 g in sodium chloride 0.9 % 100 mL IVPB (has no administration in time range)  doxycycline (VIBRA-TABS) tablet 100 mg (has no administration in time range)  azithromycin (ZITHROMAX) tablet 500 mg (has no administration in time range)  ceFEPIme (MAXIPIME) 2 g in sodium chloride 0.9 % 100 mL IVPB (0 g Intravenous Stopped 11/23/22 1933)  morphine (PF) 4 MG/ML injection 4 mg (4 mg Intravenous Given 11/23/22 1928)  ondansetron (ZOFRAN) injection 4 mg (4 mg Intravenous Given 11/23/22 1935)  doxycycline (VIBRA-TABS) tablet 100 mg (100 mg Oral Given 11/23/22 2038)    Mobility walks     Focused Assessments Renal Assessment Handoff:    R Recommendations: See Admitting Provider Note  Report given to:   Additional Notes:  Been cooperative and kind. Does not like IV's or needles and will attempt to refuse any additional IV/IM access. 18 g right a/c

## 2022-11-23 NOTE — Consult Note (Signed)
Urology Consult   Physician requesting consult: Anders Simmonds, DO  Reason for consult: Penile infection  History of Present Illness: Jason Flynn is a 37 y.o. male seen in consultation from Dr Andria Meuse for evaluation of a penile lesion and infection.  He reported a "scab" on his glans about 4 weeks ago.  He peeled it off and since that time he has had increased swelling and discharge from the area.  He has been using topical antibiotics without improvement.  He subsequently noted erosion of the skin in this area.  No pain associated other than with palpation.  No fever or chills.  He is voiding without difficulty.  He does note a separate stream coming from the lower part of his glans.  No dysuria or gross hematuria.  No urethral discharge.    He has multiple sexual partners and has unprotected sex.     History reviewed. No pertinent past medical history.  History reviewed. No pertinent surgical history.  Medications:  Scheduled Meds:  penicillin g benzathine (BICILLIN-LA) IM  2.4 Million Units Intramuscular Once   Continuous Infusions: PRN Meds:.  Allergies: No Known Allergies  History reviewed. No pertinent family history.  Social History:  reports that he has been smoking. He has never used smokeless tobacco. He reports current drug use. Drug: Marijuana. He reports that he does not drink alcohol.  ROS: A complete review of systems was performed.  All systems are negative except for pertinent findings as noted.  Physical Exam:  Vital signs in last 24 hours: Temp:  [98 F (36.7 C)] 98 F (36.7 C) (09/15 1839) Pulse Rate:  [67-80] 67 (09/15 2045) Resp:  [15] 15 (09/15 1839) BP: (121-131)/(77-93) 128/93 (09/15 2045) SpO2:  [98 %-100 %] 100 % (09/15 2045) Weight:  [160 lb (72.6 kg)] 160 lb (72.6 kg) (09/15 1739) GENERAL APPEARANCE:  Well appearing, well developed, well nourished, NAD HEENT:  Atraumatic, normocephalic, oropharynx clear NECK:  Supple  ABDOMEN:  Soft,  non-tender, no masses; bilateral inguinal adenpathy EXTREMITIES:  Moves all extremities well, without clubbing, cyanosis, or edema NEUROLOGIC:  Alert and oriented x 3, CN II-XII grossly intact MENTAL STATUS:  appropriate BACK:  Non-tender to palpation, No CVAT SKIN:  Warm, dry, and intact GU:  circumcised phallus; erosive lesion on glans extending from urethral meatus ventrally; induration of surrounding tissue; some tenderness to palpation; no fluctuance; no other lesions on penile shaft; minimal edema of penile shaft skin without tenderness or fluctuance; no necrotic areas noted  Laboratory Data:  Recent Labs    11/23/22 1853  WBC 5.4  HGB 12.6*  HCT 36.5*  PLT 344    Recent Labs    11/23/22 1853  NA 140  K 3.9  CL 100  GLUCOSE 92  BUN 10  CALCIUM 8.9  CREATININE 1.06     Results for orders placed or performed during the hospital encounter of 11/23/22 (from the past 24 hour(s))  Comprehensive metabolic panel     Status: Abnormal   Collection Time: 11/23/22  6:53 PM  Result Value Ref Range   Sodium 140 135 - 145 mmol/L   Potassium 3.9 3.5 - 5.1 mmol/L   Chloride 100 98 - 111 mmol/L   CO2 26 22 - 32 mmol/L   Glucose, Bld 92 70 - 99 mg/dL   BUN 10 6 - 20 mg/dL   Creatinine, Ser 4.33 0.61 - 1.24 mg/dL   Calcium 8.9 8.9 - 29.5 mg/dL   Total Protein 8.0 6.5 - 8.1 g/dL  Albumin 3.1 (L) 3.5 - 5.0 g/dL   AST 17 15 - 41 U/L   ALT 12 0 - 44 U/L   Alkaline Phosphatase 54 38 - 126 U/L   Total Bilirubin 0.5 0.3 - 1.2 mg/dL   GFR, Estimated >82 >95 mL/min   Anion gap 14 5 - 15  CBC with Differential     Status: Abnormal   Collection Time: 11/23/22  6:53 PM  Result Value Ref Range   WBC 5.4 4.0 - 10.5 K/uL   RBC 4.98 4.22 - 5.81 MIL/uL   Hemoglobin 12.6 (L) 13.0 - 17.0 g/dL   HCT 62.1 (L) 30.8 - 65.7 %   MCV 73.3 (L) 80.0 - 100.0 fL   MCH 25.3 (L) 26.0 - 34.0 pg   MCHC 34.5 30.0 - 36.0 g/dL   RDW 84.6 96.2 - 95.2 %   Platelets 344 150 - 400 K/uL   nRBC 0.0 0.0 - 0.2  %   Neutrophils Relative % 58 %   Neutro Abs 3.1 1.7 - 7.7 K/uL   Lymphocytes Relative 32 %   Lymphs Abs 1.7 0.7 - 4.0 K/uL   Monocytes Relative 3 %   Monocytes Absolute 0.2 0.1 - 1.0 K/uL   Eosinophils Relative 4 %   Eosinophils Absolute 0.2 0.0 - 0.5 K/uL   Basophils Relative 3 %   Basophils Absolute 0.2 (H) 0.0 - 0.1 K/uL   nRBC 0 0 /100 WBC   Abs Immature Granulocytes 0.00 0.00 - 0.07 K/uL   Target Cells PRESENT   HIV Antibody (routine testing w rflx)     Status: None   Collection Time: 11/23/22  6:53 PM  Result Value Ref Range   HIV Screen 4th Generation wRfx Non Reactive Non Reactive  Urinalysis, w/ Reflex to Culture (Infection Suspected) -Urine, Clean Catch     Status: Abnormal   Collection Time: 11/23/22  8:22 PM  Result Value Ref Range   Specimen Source URINE, CLEAN CATCH    Color, Urine YELLOW YELLOW   APPearance HAZY (A) CLEAR   Specific Gravity, Urine 1.015 1.005 - 1.030   pH 7.5 5.0 - 8.0   Glucose, UA NEGATIVE NEGATIVE mg/dL   Hgb urine dipstick NEGATIVE NEGATIVE   Bilirubin Urine SMALL (A) NEGATIVE   Ketones, ur NEGATIVE NEGATIVE mg/dL   Protein, ur NEGATIVE NEGATIVE mg/dL   Nitrite NEGATIVE NEGATIVE   Leukocytes,Ua SMALL (A) NEGATIVE   Squamous Epithelial / HPF 0-5 0 - 5 /HPF   WBC, UA 21-50 0 - 5 WBC/hpf   RBC / HPF 0-5 0 - 5 RBC/hpf   Bacteria, UA RARE (A) NONE SEEN   Mucus PRESENT    Triple Phosphate Crystal PRESENT    No results found for this or any previous visit (from the past 240 hour(s)).  Renal Function: Recent Labs    11/23/22 1853  CREATININE 1.06   Estimated Creatinine Clearance: 87.5 mL/min (by C-G formula based on SCr of 1.06 mg/dL).  Radiologic Imaging: No results found.  I independently reviewed the above imaging studies.  Impression/Recommendation Erosive penile lesion/infection  Etiology of the penile lesion is uncertain.  Given his history, possible STD with superimposed infection should be considered.  He has bilateral  inguinal adenopathy as well. No indication for surgical debridement at this time. Await results of STD testing Discussed placement of foley catheter - patient declined Apply wet to dry dressing to ulceration Recommend empiric treatment for possible causes of painless genital ulcers (syphilis, LGV, granuloma inguinale) with bicillin  and azithromycin    Di Kindle 11/23/2022, 9:22 PM

## 2022-11-24 DIAGNOSIS — N489 Disorder of penis, unspecified: Secondary | ICD-10-CM

## 2022-11-24 DIAGNOSIS — Z7251 High risk heterosexual behavior: Secondary | ICD-10-CM

## 2022-11-24 DIAGNOSIS — Z209 Contact with and (suspected) exposure to unspecified communicable disease: Secondary | ICD-10-CM

## 2022-11-24 LAB — URINE CULTURE: Culture: NO GROWTH

## 2022-11-24 LAB — RPR
RPR Ser Ql: REACTIVE — AB
RPR Titer: 1:64 {titer}

## 2022-11-24 LAB — GC/CHLAMYDIA PROBE AMP (~~LOC~~) NOT AT ARMC
Chlamydia: NEGATIVE
Comment: NEGATIVE
Comment: NORMAL
Neisseria Gonorrhea: NEGATIVE

## 2022-11-24 MED ORDER — MORPHINE SULFATE (PF) 4 MG/ML IV SOLN
4.0000 mg | Freq: Once | INTRAVENOUS | Status: DC
  Filled 2022-11-24: qty 1

## 2022-11-24 MED ORDER — DOXYCYCLINE HYCLATE 100 MG PO TABS: 100.0000 mg | ORAL_TABLET | Freq: Two times a day (BID) | ORAL | 0 refills | Status: AC

## 2022-11-24 NOTE — Progress Notes (Addendum)
Jason Flynn, Jason Flynn    Brief Narrative:   Jason Flynn is a 37 y.o. male with Jason prior past medical history who is sexually active with multiple male partners who presents to [facility] ED on 9/15 with penile lesion.  Flynn reports noticing this lesion approximately 4 weeks ago.  Initially with a scab on the underside of his glans penis in which he picked the scab off and now it continued to Jason with ulceration and discharge despite topical antibiotic use.  Flynn reports single lesion is not painful and denies dysuria.  Jason known exposure to sexually transmitted infections.  Also does endorse a separate stream of urine coming from the lower part of his glans penis besides the urethra.  In the ED, temperature 98.0 F, HR 80, RR 15, BP 131/85, SpO2 98% on room air.  WBC 5.4, hemoglobin 12.6, platelet count 344.  Sodium 140, potassium 3.9, chloride 100, CO2 26, glucose 92, BUN 10, creatinine 1.06.  AST 17, ALT 12, total bilirubin 0.5.  Urinalysis unrevealing.  Urology was consulted.  TRH consulted for admission for further evaluation and management of penile lesion concerning for STD.  Assessment & Plan:   Penile ulcer, painless Concern for urinary tract infection Concern for syphilis Flynn presenting to ED with 4-week history of progressive penile ulceration.  Lesion is painless, single in the setting of high risk sexual behavior.  Flynn is afebrile without leukocytosis.  Urinalysis unrevealing although triple phosphate crystals noted.  HIV nonreactive.  RPR reactive.  Differential including syphilis/chancre, LGV, infected traumatic lesion, granuloma inguinale, chancroid, HSV.  Seen by urology, Jason indication for surgical debridement at this time.  Flynn declined Foley catheter placement. -- Infectious disease consulted -- GC/chlamydia: Pending -- T pallidum antibody: Pending --  Blood cultures x 2: Jason growth less than 24 hours -- Urine culture: Pending -- Azithromycin 5 mg p.o. daily -- Ceftriaxone 1 g IV every 24 hours -- Doxycycline 100 mg p.o. every 12 hours -- Continue wet-to-dry dressings to the ulceration; changed every 12 hours  High risk sexual behavior with risk of exposure to communicable diseases Flynn denies any known history of intercourse with individuals with communicable diseases.  Counseled.   DVT prophylaxis: enoxaparin (LOVENOX) injection 40 mg Start: 11/24/22 1000    Code Status: Full Code Family Communication: Jason family present at bedside this morning  Disposition Plan:  Level of care: Med-Surg Status is: Observation The Flynn remains OBS appropriate and will d/c before 2 midnights.    Consultants:  Urology, Dr. Pete Glatter Infectious disease, Dr. Renold Don  Procedures:  None  Antimicrobials:  Ceftriaxone Azithromycin Doxycycline   Subjective: Flynn seen examined bedside, resting calmly.  Lying in bed.  Jason specific complaints this morning.  Denies any pain to penile lesion.  Seen by urology overnight, Jason indication for surgical intervention, but did offer Foley catheter placement given to different urinary tracts but Flynn declined.  Started on antibiotics.  RPR returned back reactive.  Consulted infectious disease.  Flynn with Jason other specific complaints or concerns at this time.  Denies dysuria, Jason headache, Jason chest pain, Jason palpitations, Jason shortness of breath, Jason fever/chills/night sweats, Jason nausea/vomiting/diarrhea, Jason focal weakness, Jason fatigue, Jason paresthesias.  Jason acute events overnight Flynn nursing staff.  Objective: Vitals:   11/23/22 2151 11/23/22 2207 11/24/22 0537 11/24/22 0803  BP:  126/84 112/69 111/69  Pulse:  89 66 (!) 59  Resp:  20 18 16   Temp: 98.5 F (36.9 C) 98.7 F (37.1 C) 98.5 F (36.9 C) 98.3 F (36.8 C)  TempSrc: Oral Oral Oral Oral  SpO2:  100% 100% 100%  Weight:      Height:       Jason  intake or output data in the 24 hours ending 11/24/22 1046 Filed Weights   11/23/22 1739  Weight: 72.6 kg    Examination:  Physical Exam: GEN: NAD, alert and oriented x 3, wd/wn HEENT: NCAT, PERRL, EOMI, sclera clear, MMM PULM: CTAB w/o wheezes/crackles, normal respiratory effort, on room air CV: RRR w/o M/G/R GI: abd soft, NTND, NABS, Jason R/G/M GU: Erythematous/erosive penile lesion noted on the ventral portion of the glans penis approximately 1 cm with clean base, Jason discharge MSK: Jason peripheral edema, muscle strength globally intact 5/5 bilateral upper/lower extremities NEURO: CN II-XII intact, Jason focal deficits, sensation to light touch intact PSYCH: normal mood/affect Integumentary: dry/intact, Jason rashes or wounds    Data Reviewed: I have personally reviewed following labs and imaging studies  CBC: Recent Labs  Lab 11/23/22 1853  WBC 5.4  NEUTROABS 3.1  HGB 12.6*  HCT 36.5*  MCV 73.3*  PLT 344   Basic Metabolic Panel: Recent Labs  Lab 11/23/22 1853  NA 140  K 3.9  CL 100  CO2 26  GLUCOSE 92  BUN 10  CREATININE 1.06  CALCIUM 8.9   GFR: Estimated Creatinine Clearance: 87.5 mL/min (by C-G formula based on SCr of 1.06 mg/dL). Liver Function Tests: Recent Labs  Lab 11/23/22 1853  AST 17  ALT 12  ALKPHOS 54  BILITOT 0.5  PROT 8.0  ALBUMIN 3.1*   Jason results for input(s): "LIPASE", "AMYLASE" in the last 168 hours. Jason results for input(s): "AMMONIA" in the last 168 hours. Coagulation Profile: Jason results for input(s): "INR", "PROTIME" in the last 168 hours. Cardiac Enzymes: Jason results for input(s): "CKTOTAL", "CKMB", "CKMBINDEX", "TROPONINI" in the last 168 hours. BNP (last 3 results) Jason results for input(s): "PROBNP" in the last 8760 hours. HbA1C: Jason results for input(s): "HGBA1C" in the last 72 hours. CBG: Jason results for input(s): "GLUCAP" in the last 168 hours. Lipid Profile: Jason results for input(s): "CHOL", "HDL", "LDLCALC", "TRIG", "CHOLHDL",  "LDLDIRECT" in the last 72 hours. Thyroid Function Tests: Jason results for input(s): "TSH", "T4TOTAL", "FREET4", "T3FREE", "THYROIDAB" in the last 72 hours. Anemia Panel: Jason results for input(s): "VITAMINB12", "FOLATE", "FERRITIN", "TIBC", "IRON", "RETICCTPCT" in the last 72 hours. Sepsis Labs: Jason results for input(s): "PROCALCITON", "LATICACIDVEN" in the last 168 hours.  Recent Results (from the past 240 hour(s))  Culture, blood (routine x 2)     Status: None (Preliminary result)   Collection Time: 11/23/22  5:55 PM   Specimen: BLOOD  Result Value Ref Range Status   Specimen Description BLOOD SITE NOT SPECIFIED  Final   Special Requests   Final    BOTTLES DRAWN AEROBIC AND ANAEROBIC Blood Culture adequate volume   Culture   Final    Jason GROWTH < 12 HOURS Performed at [facility], [address] [place], [place] [address]    Report Status PENDING  Incomplete  Culture, blood (routine x 2)     Status: None (Preliminary result)   Collection Time: 11/23/22  6:00 PM   Specimen: BLOOD  Result Value Ref Range Status   Specimen Description BLOOD SITE NOT SPECIFIED  Final   Special Requests   Final    BOTTLES DRAWN AEROBIC AND  ANAEROBIC Blood Culture adequate volume   Culture   Final    Jason GROWTH < 12 HOURS Performed at [facility], [address] [place], [place] [address]    Report Status PENDING  Incomplete         Radiology Studies: Jason results found.      Scheduled Meds:  azithromycin  500 mg Oral Daily   doxycycline  100 mg Oral Q12H   enoxaparin (LOVENOX) injection  40 mg Subcutaneous Q24H    morphine injection  4 mg Intravenous Once   Continuous Infusions:  cefTRIAXone (ROCEPHIN)  IV 1 g (11/24/22 0216)     LOS: 0 days    Time spent: 52 minutes spent on chart review, discussion with nursing staff, consultants, updating family and interview/physical exam; more than 50% of that time was spent in counseling and/or coordination of care.    Alvira Philips  [place], DO [facility] Available via Epic secure chat 7am-7pm After these hours, please refer to coverage provider listed on [email or web address] 11/24/2022, 10:46 AM

## 2022-11-24 NOTE — Progress Notes (Signed)
Patient was unhappy with the care he was receiving at this hospital and requested to sign out AMA.  RN reviewed the treatment plan and encouraged him to stay for his antibiotics and diagnosis, patient said he wanted to go elsewhere for care

## 2022-11-24 NOTE — Progress Notes (Signed)
Patient initially consented to obtaining STD swabs. Obtained one swab but patient refused second swab. Patient states that down in the ED he got tested for STDs. I told him they did blood lab testing but this is testing for other STDs. Patient states "my girl is clean. I dont got herpes or gonorrhea". Patient continuing to refuse despite education. Patient also refused further labs. Patient states that they already took blood from him in the ED and "that should be enough". Sent message to provider.

## 2022-11-24 NOTE — Discharge Summary (Addendum)
[facility] Physician Discharge Summary  Jason Flynn:[id number] DOB: Apr 04, 1985 DOA: 11/23/2022  PCP: Patient, No Pcp Per  Admit date: 11/23/2022 Discharge date: 11/24/2022  Admitted From: Home Disposition: Left AGAINST MEDICAL ADVICE  History of present illness:  Jason Flynn is a 37 y.o. male with no prior past medical history who is sexually active with multiple male partners who presents to [facility] ED on 9/15 with penile lesion.  Patient reports noticing this lesion approximately 4 weeks ago.  Initially with a scab on the underside of his glans penis in which he picked the scab off and now it continued to progress with ulceration and discharge despite topical antibiotic use.  Patient reports single lesion is not painful and denies dysuria.  No known exposure to sexually transmitted infections.  Also does endorse a separate stream of urine coming from the lower part of his glans penis besides the urethra.   In the ED, temperature 98.0 F, HR 80, RR 15, BP 131/85, SpO2 98% on room air.  WBC 5.4, hemoglobin 12.6, platelet count 344.  Sodium 140, potassium 3.9, chloride 100, CO2 26, glucose 92, BUN 10, creatinine 1.06.  AST 17, ALT 12, total bilirubin 0.5.  Urinalysis unrevealing.  Urology was consulted.  TRH consulted for admission for further evaluation and management of penile lesion concerning for STD.  Hospital course:  Penile ulcer, painless Concern for urinary tract infection Concern for syphilis Patient presenting to ED with 4-week history of progressive penile ulceration.  Lesion is painless, single in the setting of high risk sexual behavior.  Patient is afebrile without leukocytosis.  Urinalysis unrevealing although triple phosphate crystals noted.  HIV nonreactive.  RPR reactive.  Differential including syphilis/chancre, LGV, infected traumatic lesion, granuloma inguinale, chancroid, HSV.  Seen by urology, no indication for surgical debridement at this time.   Patient declined Foley catheter placement.  Blood cultures x 2 showed no growth x 24 hours.  Urine culture, GC chlamydia,T pallidum antibody were pending at time of discharge.  Patient was started on azithromycin, ceftriaxone and doxycycline.  Was pending ID consultation but patient decided to leave AGAINST MEDICAL ADVICE.  He was seen by infectious disease with recommendations of doxycycline 100 mg p.o. twice daily x 14 days.  Reasonable efforts were made to advise the patient of the benefit of staying for evaluation as well as treatment.   High risk sexual behavior with risk of exposure to communicable diseases Patient denies any known history of intercourse with individuals with communicable diseases.  Counseled.  Discharge Diagnoses:  Principal Problem:   Penile lesion Active Problems:   Sexual behavior with high risk of exposure to communicable disease    Discharge Instructions     No Known Allergies  Consultations: Urology Infectious disease   Procedures/Studies: No results found.   Subjective: Patient leaving AMA  Discharge Exam: Vitals:   11/24/22 0803 11/24/22 1147  BP: 111/69 122/78  Pulse: (!) 59 93  Resp: 16 16  Temp: 98.3 F (36.8 C) 98.9 F (37.2 C)  SpO2: 100% 100%   Vitals:   11/23/22 2207 11/24/22 0537 11/24/22 0803 11/24/22 1147  BP: 126/84 112/69 111/69 122/78  Pulse: 89 66 (!) 59 93  Resp: 20 18 16 16   Temp: 98.7 F (37.1 C) 98.5 F (36.9 C) 98.3 F (36.8 C) 98.9 F (37.2 C)  TempSrc: Oral Oral Oral Oral  SpO2: 100% 100% 100% 100%  Weight:      Height:  Physical Exam: GEN: NAD, alert and oriented x 3, wd/wn HEENT: NCAT, PERRL, EOMI, sclera clear, MMM PULM: CTAB w/o wheezes/crackles, normal respiratory effort, on room air CV: RRR w/o M/G/R GI: abd soft, NTND, NABS, no R/G/M GU: Erythematous/erosive penile lesion noted on the ventral portion of the glans penis approximately 1 cm with clean base, no discharge MSK: no  peripheral edema, muscle strength globally intact 5/5 bilateral upper/lower extremities NEURO: CN II-XII intact, no focal deficits, sensation to light touch intact PSYCH: normal mood/affect Integumentary: dry/intact, no rashes or wounds    The results of significant diagnostics from this hospitalization (including imaging, microbiology, ancillary and laboratory) are listed below for reference.     Microbiology: Recent Results (from the past 240 hour(s))  Culture, blood (routine x 2)     Status: None (Preliminary result)   Collection Time: 11/23/22  5:55 PM   Specimen: BLOOD  Result Value Ref Range Status   Specimen Description BLOOD SITE NOT SPECIFIED  Final   Special Requests   Final    BOTTLES DRAWN AEROBIC AND ANAEROBIC Blood Culture adequate volume   Culture   Final    NO GROWTH < 12 HOURS Performed at [facility] Lab, [address], [place], [place] [address]    Report Status PENDING  Incomplete  Culture, blood (routine x 2)     Status: None (Preliminary result)   Collection Time: 11/23/22  6:00 PM   Specimen: BLOOD  Result Value Ref Range Status   Specimen Description BLOOD SITE NOT SPECIFIED  Final   Special Requests   Final    BOTTLES DRAWN AEROBIC AND ANAEROBIC Blood Culture adequate volume   Culture   Final    NO GROWTH < 12 HOURS Performed at [facility] Lab, [address], [place], [place] [address]    Report Status PENDING  Incomplete     Labs: BNP (last 3 results) No results for input(s): "BNP" in the last 8760 hours. Basic Metabolic Panel: Recent Labs  Lab 11/23/22 1853  NA 140  K 3.9  CL 100  CO2 26  GLUCOSE 92  BUN 10  CREATININE 1.06  CALCIUM 8.9   Liver Function Tests: Recent Labs  Lab 11/23/22 1853  AST 17  ALT 12  ALKPHOS 54  BILITOT 0.5  PROT 8.0  ALBUMIN 3.1*   No results for input(s): "LIPASE", "AMYLASE" in the last 168 hours. No results for input(s): "AMMONIA" in the last 168 hours. CBC: Recent Labs  Lab  11/23/22 1853  WBC 5.4  NEUTROABS 3.1  HGB 12.6*  HCT 36.5*  MCV 73.3*  PLT 344   Cardiac Enzymes: No results for input(s): "CKTOTAL", "CKMB", "CKMBINDEX", "TROPONINI" in the last 168 hours. BNP: Invalid input(s): "POCBNP" CBG: No results for input(s): "GLUCAP" in the last 168 hours. D-Dimer No results for input(s): "DDIMER" in the last 72 hours. Hgb A1c No results for input(s): "HGBA1C" in the last 72 hours. Lipid Profile No results for input(s): "CHOL", "HDL", "LDLCALC", "TRIG", "CHOLHDL", "LDLDIRECT" in the last 72 hours. Thyroid function studies No results for input(s): "TSH", "T4TOTAL", "T3FREE", "THYROIDAB" in the last 72 hours.  Invalid input(s): "FREET3" Anemia work up No results for input(s): "VITAMINB12", "FOLATE", "FERRITIN", "TIBC", "IRON", "RETICCTPCT" in the last 72 hours. Urinalysis    Component Value Date/Time   COLORURINE YELLOW 11/23/2022 2022   APPEARANCEUR HAZY (A) 11/23/2022 2022   LABSPEC 1.015 11/23/2022 2022   PHURINE 7.5 11/23/2022 2022   GLUCOSEU NEGATIVE 11/23/2022 2022   HGBUR  NEGATIVE 11/23/2022 2022   BILIRUBINUR SMALL (A) 11/23/2022 2022   KETONESUR NEGATIVE 11/23/2022 2022   PROTEINUR NEGATIVE 11/23/2022 2022   NITRITE NEGATIVE 11/23/2022 2022   LEUKOCYTESUR SMALL (A) 11/23/2022 2022   Sepsis Labs Recent Labs  Lab 11/23/22 1853  WBC 5.4   Microbiology Recent Results (from the past 240 hour(s))  Culture, blood (routine x 2)     Status: None (Preliminary result)   Collection Time: 11/23/22  5:55 PM   Specimen: BLOOD  Result Value Ref Range Status   Specimen Description BLOOD SITE NOT SPECIFIED  Final   Special Requests   Final    BOTTLES DRAWN AEROBIC AND ANAEROBIC Blood Culture adequate volume   Culture   Final    NO GROWTH < 12 HOURS Performed at [facility] Lab, [address], [place], [place] [address]    Report Status PENDING  Incomplete  Culture, blood (routine x 2)     Status: None (Preliminary result)    Collection Time: 11/23/22  6:00 PM   Specimen: BLOOD  Result Value Ref Range Status   Specimen Description BLOOD SITE NOT SPECIFIED  Final   Special Requests   Final    BOTTLES DRAWN AEROBIC AND ANAEROBIC Blood Culture adequate volume   Culture   Final    NO GROWTH < 12 HOURS Performed at [facility] Lab, [address], [place], [place] [address]    Report Status PENDING  Incomplete     Time coordinating discharge: Over 30 minutes  SIGNED:   Alvira Philips [place], DO  [facility] 11/24/2022, 2:21 PM

## 2022-11-24 NOTE — H&P (Signed)
History and Physical    SAMSON MARKET VHQ:[id number] DOB: 20-Aug-1985 DOA: 11/23/2022  PCP: Patient, No Pcp Per   Patient coming from: Home   Chief Complaint:  Chief Complaint  Patient presents with   Penis Pain    HPI:  Jason Flynn is a 37 y.o. male with no prior medical history, sexually active with multiple male partners, who presents with penile lesion.  Onset of symptoms 4 weeks ago.  Had initially scabbed lesion on the underside of his penis near the meatus.  He picked off the scab and continued to have an ulcerated lesion.  Subsequently developing more discharge in the past week.  Purulent discharge from the meatus.  The lesion is nonpainful, nonpruritic.  No other associated skin lesions anywhere else.  Has minimal dysuria.  No known exposure to sexually transmitted infections.   Review of Systems:  ROS complete and negative except as marked above   No Known Allergies  Prior to Admission medications   Not on File  None   History reviewed. No pertinent past medical history.  History reviewed. No pertinent surgical history.   reports that he has been smoking. He has never used smokeless tobacco. He reports current drug use. Drug: Marijuana. He reports that he does not drink alcohol.  History reviewed. No pertinent family history.   Physical Exam: Vitals:   11/23/22 2115 11/23/22 2145 11/23/22 2151 11/23/22 2207  BP: (!) 142/91 137/83  126/84  Pulse: 76 60  89  Resp: 17   20  Temp:   98.5 F (36.9 C) 98.7 F (37.1 C)  TempSrc:   Oral Oral  SpO2: 100% 100%  100%  Weight:      Height:        Gen: Awake, alert, NAD   CV: Regular, normal S1, S2, no murmurs  Resp: Normal WOB, CTAB  Abd: Flat, normoactive, nontender GU: On the volar aspect at the neck of the glans and involving some of the glans there is an ulcerated lesion approx 1 cm with clean base, heaped up borders, and there is purulent discharge coming from the meatus. There is crusted lesion  around the neck of the glans.  MSK: Symmetric, no edema  Skin: See GU for findings  Neuro: Alert and interactive  Psych: euthymic, appropriate    Data review:   Labs reviewed, notable for:  WBC within normal limits UA with pyuria rare bacteria small leukocyte   Micro:  Results for orders placed or performed during the hospital encounter of 11/06/20  SARS CORONAVIRUS 2 (TAT 6-24 HRS) Nasopharyngeal Nasopharyngeal Swab     Status: Abnormal   Collection Time: 11/06/20  7:30 PM   Specimen: Nasopharyngeal Swab  Result Value Ref Range Status   SARS Coronavirus 2 POSITIVE (A) NEGATIVE Final    Comment: (NOTE) SARS-CoV-2 target nucleic acids are DETECTED.  The SARS-CoV-2 RNA is generally detectable in upper and lower respiratory specimens during the acute phase of infection. Positive results are indicative of the presence of SARS-CoV-2 RNA. Clinical correlation with patient history and other diagnostic information is  necessary to determine patient infection status. Positive results do not rule out bacterial infection or co-infection with other viruses.  The expected result is Negative.  Fact Sheet for Patients: HairSlick.no  Fact Sheet for Healthcare Providers: quierodirigir.com  This test is not yet approved or cleared by the [place] FDA and  has been authorized for detection and/or diagnosis of SARS-CoV-2 by FDA under an Emergency Use Authorization (EUA).  This EUA will remain  in effect (meaning this test can be used) for the duration of the COVID-19 declaration under Section 564(b)(1) of the Act, 21 U. S.C. section 360bbb-3(b)(1), unless the authorization is terminated or revoked sooner.   Performed at [facility] Lab, [address], [place], [place] [address]     Imaging reviewed:  No results found.  ED Course: Case was discussed with urology who evaluated the patient.  Recommending empiric treatment of  genital ulcer.  Patient refused initial treatment with penicillin G.    Assessment/Plan:  37 y.o. male with no medical history, sexually active with multiple male partners, presents with subacute history of a painless penile ulcer and discharge.  Penile ulcer, painless Possible urinary tract infection DDx include primary syphilis/chancre, LGV, infected traumatic lesion.  Less likely causes including granuloma inguinle, chancroid, HSV.  - Check HIV, RPR, GC/CT, trichomonas - Check hep B C serologies with sexual history - Empiric treatment with ceftriaxone would also cover for potential UTI, doxycycline 100 mg twice daily for chlamydia coverage, and azithromycin 500 mg daily for granuloma inguinale coverage - Thus far had refused an empiric dose of penicillin G IM but after further discussion about the natural progression of syphilis he is amenable to this.  For now we will get RPR to make further decisions on treatment. - However due to potential for false negative nontreponemal test in 20 to 30% and early syphilis, if RPR is negative would obtain a syphilis antibody and still pursue treatment with IM penicillin G.  And he should have follow-up serologic testing done outpatient  Body mass index is 25.06 kg/m.    DVT prophylaxis:   None Code Status:  Full Code Diet:  Diet Orders (From admission, onward)     Start     Ordered   11/23/22 2126  Diet regular Room service appropriate? Yes; Fluid consistency: Thin  Diet effective now       Question Answer Comment  Room service appropriate? Yes   Fluid consistency: Thin      11/23/22 2125           Family Communication: No Consults: Urology Admission status:   Observation, Med-Surg  Severity of Illness: The appropriate patient status for this patient is OBSERVATION. Observation status is judged to be reasonable and necessary in order to provide the required intensity of service to ensure the patient's safety. The patient's  presenting symptoms, physical exam findings, and initial radiographic and laboratory data in the context of their medical condition is felt to place them at decreased risk for further clinical deterioration. Furthermore, it is anticipated that the patient will be medically stable for discharge from the hospital within 2 midnights of admission.    Dolly Rias, MD [facility]  How to contact the [facility] Attending or Consulting provider 7A - 7P or covering provider during after hours 7P -7A, for this patient.  Check the care team in [facility] and look for a) attending/consulting TRH provider listed and b) the [facility] team listed Log into [email or web address] and use [facility]'s universal password to access. If you do not have the password, please contact the hospital operator. Locate the [facility] provider you are looking for under [facility] and page to a number that you can be directly reached. If you still have difficulty reaching the provider, please page the [facility] (Director on Call) for the Hospitalists listed on amion for assistance.  11/24/2022, 12:03 AM

## 2022-11-24 NOTE — Consult Note (Signed)
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 male admitted for genital ulcer  Endorsed multiple sex partner Heterosexual  Noticed an eschar/ulcer 3-4 weeks ago. Nonpainful Putting h2o2 on it  No fever, chill No headache, n/v/diarrhea, joint pain, visual change/hearing change  Doesn't know who he got it from  No travel  All partners are local and nontravelers  Rpr positive  Hiv negative     History reviewed. No pertinent family history.  Social History   Tobacco Use   Smoking status: Every Day    Current packs/day: 0.50    Types: Cigarettes   Smokeless tobacco: Never  Substance Use Topics   Alcohol use: No   Drug use: Yes    Types: Marijuana    Comment: everyday    No Known Allergies  Review of Systems: ROS All Other ROS was negative, except mentioned above   History reviewed. No pertinent past medical history.     Scheduled Meds:  azithromycin  500 mg Oral Daily   doxycycline  100 mg Oral Q12H   enoxaparin (LOVENOX) injection  40 mg Subcutaneous Q24H    morphine injection  4 mg Intravenous  Once   Continuous Infusions:  cefTRIAXone (ROCEPHIN)  IV 1 g (11/24/22 0216)   PRN Meds:.   OBJECTIVE: Blood pressure 122/78, pulse 93, temperature 98.9 F (37.2 C), temperature source Oral, resp. rate 16, height 5\' 7"  (1.702 m), weight 72.6 kg, SpO2 100%.  Physical Exam  General/constitutional: no distress, pleasant HEENT: Normocephalic, PER, Conj Clear, EOMI, Oropharynx clear Neck supple CV: rrr no mrg Lungs: clear to auscultation, normal respiratory effort Abd: Soft, Nontender Ext: no edema Skin: 1cm clean based ulcer with heaped up edge at dorsal of glans penis; no grooves sign in the groin Neuro: nonfocal MSK: no peripheral joint swelling/tenderness/warmth; back spines nontender    Lab Results Lab Results  Component Value Date   WBC 5.4 11/23/2022   HGB 12.6 (L) 11/23/2022   HCT 36.5 (L) 11/23/2022   MCV 73.3 (L) 11/23/2022   PLT 344 11/23/2022    Lab Results  Component Value Date   CREATININE 1.06 11/23/2022   BUN 10 11/23/2022   NA 140 11/23/2022   K 3.9 11/23/2022   CL 100 11/23/2022   CO2 26 11/23/2022    Lab Results  Component Value Date   ALT 12 11/23/2022   AST 17 11/23/2022   ALKPHOS 54 11/23/2022   BILITOT 0.5 11/23/2022      Microbiology: Recent Results (from the  past 240 hour(s))  Culture, blood (routine x 2)     Status: None (Preliminary result)   Collection Time: 11/23/22  5:55 PM   Specimen: BLOOD  Result Value Ref Range Status   Specimen Description BLOOD SITE NOT SPECIFIED  Final   Special Requests   Final    BOTTLES DRAWN AEROBIC AND ANAEROBIC Blood Culture adequate volume   Culture   Final    NO GROWTH < 12 HOURS Performed at [facility] Lab, [address], [place], [place] [address]    Report Status PENDING  Incomplete  Culture, blood (routine x 2)     Status: None (Preliminary result)   Collection Time: 11/23/22  6:00 PM   Specimen: BLOOD  Result Value Ref Range Status   Specimen Description BLOOD SITE NOT SPECIFIED   Final   Special Requests   Final    BOTTLES DRAWN AEROBIC AND ANAEROBIC Blood Culture adequate volume   Culture   Final    NO GROWTH < 12 HOURS Performed at [facility], [address] [place], [place] [address]    Report Status PENDING  Incomplete     Serology:    Imaging: If present, new imagings (plain films, ct scans, and mri) have been personally visualized and interpreted; radiology reports have been reviewed. Decision making incorporated into the Impression / Recommendations.    Raymondo Band, MD [facility] [phone] pager    11/24/2022, 3:01 PM

## 2022-11-24 NOTE — Progress Notes (Signed)
     Subjective: NAEON. Pt resting in bed. No reported pain.   Objective: Vital signs in last 24 hours: Temp:  [98 F (36.7 C)-98.9 F (37.2 C)] 98.9 F (37.2 C) (09/16 1147) Pulse Rate:  [59-93] 93 (09/16 1147) Resp:  [15-20] 16 (09/16 1147) BP: (111-142)/(69-93) 122/78 (09/16 1147) SpO2:  [98 %-100 %] 100 % (09/16 1147) Weight:  [72.6 kg] 72.6 kg (09/15 1739)  Assessment/Plan: #erosive penile lesion Wound stable with no purulence. Picture included in media tab.  Still awaiting GC/chlamydia and T pallidum antibody.  Will follow along with you.   Intake/Output from previous day: No intake/output data recorded.  Intake/Output this shift: No intake/output data recorded.  Physical Exam:  General: Alert and oriented CV: No cyanosis Lungs: equal chest rise Gu: ulcerative lesion on the ventral aspect of glans  Lab Results: Recent Labs    11/23/22 1853  HGB 12.6*  HCT 36.5*   BMET Recent Labs    11/23/22 1853  NA 140  K 3.9  CL 100  CO2 26  GLUCOSE 92  BUN 10  CREATININE 1.06  CALCIUM 8.9     Studies/Results: No results found.    LOS: 0 days   Elmon Kirschner, NP [facility] Pager: [phone]  11/24/2022, 11:57 AM

## 2022-11-24 NOTE — H&P (Incomplete)
History and Physical    Patient: Jason Flynn ZOX:[id number] DOB: [birth year] DOA: 11/23/2022 DOS: the patient was seen and examined on 11/24/2022 PCP: Patient, No Pcp Per  Patient coming from: {Point_of_Origin:[address]}  Chief Complaint:  Chief Complaint  Patient presents with  . Penis Pain   HPI: UYLESS SAFRON is a 37 y.o. male with medical history significant of ***  Review of Systems: {ROS_Text:26778} History reviewed. No pertinent past medical history. History reviewed. No pertinent surgical history. Social History:  reports that he has been smoking. He has never used smokeless tobacco. He reports current drug use. Drug: Marijuana. He reports that he does not drink alcohol.  No Known Allergies  History reviewed. No pertinent family history.  Prior to Admission medications   Not on File    Physical Exam: Vitals:   11/23/22 2115 11/23/22 2145 11/23/22 2151 11/23/22 2207  BP: (!) 142/91 137/83  126/84  Pulse: 76 60  89  Resp: 17   20  Temp:   98.5 F (36.9 C) 98.7 F (37.1 C)  TempSrc:   Oral Oral  SpO2: 100% 100%  100%  Weight:      Height:       *** Data Reviewed: {Tip this will not be part of the note when signed- Document your independent interpretation of telemetry tracing, EKG, lab, Radiology test or any other diagnostic tests. Add any new diagnostic test ordered today. (Optional):26781} {Results:26384}  Assessment and Plan: No notes have been filed under this hospital service. Service: Hospitalist     Advance Care Planning:   Code Status: Full Code ***  Consults: ***  Family Communication: ***  Severity of Illness: {Observation/Inpatient:21159}  Author: Dolly Rias, MD 11/24/2022 12:02 AM  For on call review [email or web address].

## 2022-11-25 LAB — GC/CHLAMYDIA PROBE AMP (~~LOC~~) NOT AT ARMC
Chlamydia: NEGATIVE
Comment: NEGATIVE
Comment: NORMAL
Neisseria Gonorrhea: NEGATIVE

## 2022-11-25 LAB — T.PALLIDUM AB, TOTAL: T Pallidum Abs: REACTIVE — AB

## 2022-11-28 LAB — CULTURE, BLOOD (ROUTINE X 2)
Culture: NO GROWTH
Culture: NO GROWTH
Special Requests: ADEQUATE
Special Requests: ADEQUATE

## 2023-07-31 ENCOUNTER — Emergency Department (HOSPITAL_COMMUNITY)
Admission: EM | Admit: 2023-07-31 | Discharge: 2023-07-31 | Disposition: A | Discharge status: Home / Self Care | Payer: Self-pay | Attending: Emergency Medicine | Admitting: Emergency Medicine

## 2023-07-31 ENCOUNTER — Encounter (HOSPITAL_COMMUNITY): Payer: Self-pay

## 2023-07-31 ENCOUNTER — Emergency Department (HOSPITAL_COMMUNITY): Payer: Self-pay

## 2023-07-31 ENCOUNTER — Other Ambulatory Visit: Payer: Self-pay

## 2023-07-31 DIAGNOSIS — S6992XA Unspecified injury of left wrist, hand and finger(s), initial encounter: Secondary | ICD-10-CM | POA: Insufficient documentation

## 2023-07-31 DIAGNOSIS — X58XXXA Exposure to other specified factors, initial encounter: Secondary | ICD-10-CM | POA: Insufficient documentation

## 2023-07-31 DIAGNOSIS — M79645 Pain in left finger(s): Secondary | ICD-10-CM

## 2023-07-31 MED ORDER — OXYCODONE-ACETAMINOPHEN 5-325 MG PO TABS
1.0000 | ORAL_TABLET | ORAL | 0 refills | Status: AC | PRN
Start: 2023-07-31 — End: ?

## 2023-07-31 MED ORDER — OXYCODONE HCL 5 MG PO TABS
5.0000 mg | ORAL_TABLET | Freq: Once | ORAL | Status: AC
  Administered 2023-07-31: 5 mg via ORAL
  Filled 2023-07-31: qty 1

## 2023-07-31 MED ORDER — ACETAMINOPHEN 500 MG PO TABS
1000.0000 mg | ORAL_TABLET | Freq: Once | ORAL | Status: AC
  Administered 2023-07-31: 1000 mg via ORAL
  Filled 2023-07-31: qty 2
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
## 2023-07-31 NOTE — Discharge Instructions (Addendum)
 Your history, exam, and evaluation today are consistent with a likely nonbony injury to the left hand and thumb area.  As we discussed, your injury was about a week ago so we would expect that if it was a bony injury we might see it on x-ray but it was reassuring.  I am concerned about other soft tissue or ligamentous and tendinous injuries in the left hand causing the pain.  You had intact sensation, blood flow with capillary refill, and could move it, it was just limited due to all the pain.  Please continue to use the thumb spica brace that you are wearing and use the pain medicine to help with symptoms.  Please follow-up with hand surgery soon as possible for close follow-up.  If any symptoms change or worsen acutely, please return to the nearest emergency department.
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
## 2023-07-31 NOTE — ED Notes (Signed)
 Pt understood d/c instructions and when to return to the ED. Medications taught and understood. No questions at this time
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
